# Patient Record
Sex: Female | Born: 1955 | Race: White | Hispanic: No | Marital: Married | Smoking: Former smoker
Health system: Southern US, Community
[De-identification: ages and names within clinical notes are randomized; demographics above are authoritative.]

## PROBLEM LIST (undated history)

## (undated) DIAGNOSIS — T7840XA Allergy, unspecified, initial encounter: Secondary | ICD-10-CM

## (undated) DIAGNOSIS — E0590 Thyrotoxicosis, unspecified without thyrotoxic crisis or storm: Secondary | ICD-10-CM

## (undated) DIAGNOSIS — Z720 Tobacco use: Secondary | ICD-10-CM

## (undated) DIAGNOSIS — E785 Hyperlipidemia, unspecified: Secondary | ICD-10-CM

## (undated) DIAGNOSIS — R5600 Simple febrile convulsions: Secondary | ICD-10-CM

## (undated) DIAGNOSIS — Z972 Presence of dental prosthetic device (complete) (partial): Secondary | ICD-10-CM

## (undated) DIAGNOSIS — Z9889 Other specified postprocedural states: Secondary | ICD-10-CM

## (undated) DIAGNOSIS — R112 Nausea with vomiting, unspecified: Secondary | ICD-10-CM

## (undated) DIAGNOSIS — M1990 Unspecified osteoarthritis, unspecified site: Secondary | ICD-10-CM

## (undated) DIAGNOSIS — K219 Gastro-esophageal reflux disease without esophagitis: Secondary | ICD-10-CM

## (undated) DIAGNOSIS — Z8669 Personal history of other diseases of the nervous system and sense organs: Secondary | ICD-10-CM

## (undated) DIAGNOSIS — E0500 Thyrotoxicosis with diffuse goiter without thyrotoxic crisis or storm: Secondary | ICD-10-CM

## (undated) HISTORY — DX: Hyperlipidemia, unspecified: E78.5

## (undated) HISTORY — PX: TONSILECTOMY, ADENOIDECTOMY, BILATERAL MYRINGOTOMY AND TUBES: SHX2538

## (undated) HISTORY — DX: Personal history of other diseases of the nervous system and sense organs: Z86.69

## (undated) HISTORY — PX: KNEE SURGERY: SHX244

## (undated) HISTORY — PX: TUBAL LIGATION: SHX77

## (undated) HISTORY — DX: Thyrotoxicosis, unspecified without thyrotoxic crisis or storm: E05.90

## (undated) HISTORY — DX: Unspecified osteoarthritis, unspecified site: M19.90

## (undated) HISTORY — DX: Gastro-esophageal reflux disease without esophagitis: K21.9

## (undated) HISTORY — DX: Allergy, unspecified, initial encounter: T78.40XA

## (undated) HISTORY — DX: Tobacco use: Z72.0

---

## 2000-12-16 ENCOUNTER — Other Ambulatory Visit: Admission: RE | Admit: 2000-12-16 | Discharge: 2000-12-16 | Payer: Self-pay | Admitting: Obstetrics and Gynecology

## 2001-11-29 ENCOUNTER — Other Ambulatory Visit: Admission: RE | Admit: 2001-11-29 | Discharge: 2001-11-29 | Payer: Self-pay | Admitting: Obstetrics and Gynecology

## 2002-12-20 ENCOUNTER — Ambulatory Visit (HOSPITAL_COMMUNITY): Admission: RE | Admit: 2002-12-20 | Discharge: 2002-12-20 | Payer: Self-pay | Admitting: Obstetrics and Gynecology

## 2003-01-19 ENCOUNTER — Other Ambulatory Visit: Admission: RE | Admit: 2003-01-19 | Discharge: 2003-01-19 | Payer: Self-pay | Admitting: Obstetrics and Gynecology

## 2003-01-20 ENCOUNTER — Encounter: Admission: RE | Admit: 2003-01-20 | Discharge: 2003-01-20 | Payer: Self-pay | Admitting: Obstetrics and Gynecology

## 2003-05-18 ENCOUNTER — Encounter: Admission: RE | Admit: 2003-05-18 | Discharge: 2003-05-18 | Payer: Self-pay | Admitting: Obstetrics and Gynecology

## 2004-02-15 ENCOUNTER — Other Ambulatory Visit: Admission: RE | Admit: 2004-02-15 | Discharge: 2004-02-15 | Payer: Self-pay | Admitting: Obstetrics and Gynecology

## 2004-02-20 ENCOUNTER — Encounter: Admission: RE | Admit: 2004-02-20 | Discharge: 2004-02-20 | Payer: Self-pay | Admitting: Obstetrics and Gynecology

## 2005-03-13 ENCOUNTER — Other Ambulatory Visit: Admission: RE | Admit: 2005-03-13 | Discharge: 2005-03-13 | Payer: Self-pay | Admitting: Obstetrics & Gynecology

## 2006-04-30 ENCOUNTER — Other Ambulatory Visit: Admission: RE | Admit: 2006-04-30 | Discharge: 2006-04-30 | Payer: Self-pay | Admitting: Obstetrics & Gynecology

## 2007-05-04 ENCOUNTER — Other Ambulatory Visit: Admission: RE | Admit: 2007-05-04 | Discharge: 2007-05-04 | Payer: Self-pay | Admitting: Obstetrics and Gynecology

## 2007-05-05 ENCOUNTER — Encounter: Admission: RE | Admit: 2007-05-05 | Discharge: 2007-05-05 | Payer: Self-pay | Admitting: Obstetrics and Gynecology

## 2010-06-25 ENCOUNTER — Other Ambulatory Visit (HOSPITAL_COMMUNITY): Payer: Self-pay | Admitting: Internal Medicine

## 2010-06-25 DIAGNOSIS — E0590 Thyrotoxicosis, unspecified without thyrotoxic crisis or storm: Secondary | ICD-10-CM

## 2010-07-10 ENCOUNTER — Encounter (HOSPITAL_COMMUNITY)
Admission: RE | Admit: 2010-07-10 | Discharge: 2010-07-10 | Disposition: A | Discharge status: Home / Self Care | Payer: BC Managed Care – PPO | Source: Ambulatory Visit | Attending: Internal Medicine | Admitting: Internal Medicine

## 2010-07-10 DIAGNOSIS — E0590 Thyrotoxicosis, unspecified without thyrotoxic crisis or storm: Secondary | ICD-10-CM | POA: Insufficient documentation

## 2010-07-11 ENCOUNTER — Encounter (HOSPITAL_COMMUNITY)
Admission: RE | Admit: 2010-07-11 | Discharge: 2010-07-11 | Disposition: A | Discharge status: Home / Self Care | Payer: BC Managed Care – PPO | Source: Ambulatory Visit | Attending: Internal Medicine | Admitting: Internal Medicine

## 2010-07-11 MED ORDER — SODIUM PERTECHNETATE TC 99M INJECTION
9.6000 | Freq: Once | INTRAVENOUS | Status: AC | PRN
  Administered 2010-07-11: 9.6 via INTRAVENOUS

## 2010-07-11 MED ORDER — SODIUM IODIDE I 131 CAPSULE
10.1000 | Freq: Once | INTRAVENOUS | Status: AC | PRN
  Administered 2010-07-10: 10.1 via ORAL

## 2011-06-30 ENCOUNTER — Encounter: Payer: Self-pay | Admitting: *Deleted

## 2014-02-22 ENCOUNTER — Ambulatory Visit: Payer: Self-pay | Admitting: Podiatry

## 2014-07-25 ENCOUNTER — Ambulatory Visit (INDEPENDENT_AMBULATORY_CARE_PROVIDER_SITE_OTHER): Payer: BC Managed Care – PPO | Admitting: Family Medicine

## 2014-07-25 ENCOUNTER — Encounter: Payer: Self-pay | Admitting: Family Medicine

## 2014-07-25 ENCOUNTER — Encounter (INDEPENDENT_AMBULATORY_CARE_PROVIDER_SITE_OTHER): Payer: Self-pay

## 2014-07-25 VITALS — BP 110/82 | HR 85 | Temp 98.6°F | Ht 62.25 in | Wt 134.5 lb

## 2014-07-25 DIAGNOSIS — G43909 Migraine, unspecified, not intractable, without status migrainosus: Secondary | ICD-10-CM | POA: Insufficient documentation

## 2014-07-25 DIAGNOSIS — J309 Allergic rhinitis, unspecified: Secondary | ICD-10-CM

## 2014-07-25 DIAGNOSIS — M1990 Unspecified osteoarthritis, unspecified site: Secondary | ICD-10-CM | POA: Diagnosis not present

## 2014-07-25 DIAGNOSIS — G44229 Chronic tension-type headache, not intractable: Secondary | ICD-10-CM | POA: Diagnosis not present

## 2014-07-25 DIAGNOSIS — G43B0 Ophthalmoplegic migraine, not intractable: Secondary | ICD-10-CM

## 2014-07-25 DIAGNOSIS — Z13220 Encounter for screening for lipoid disorders: Secondary | ICD-10-CM | POA: Diagnosis not present

## 2014-07-25 LAB — LIPID PANEL
Cholesterol: 202 mg/dL — ABNORMAL HIGH (ref 0–200)
HDL: 40.2 mg/dL (ref >39.00)
LDL Cholesterol: 136 mg/dL — ABNORMAL HIGH (ref 0–99)
NonHDL: 161.8
Total CHOL/HDL Ratio: 5
Triglycerides: 131 mg/dL (ref 0.0–149.0)
VLDL: 26.2 mg/dL (ref 0.0–40.0)

## 2014-07-25 LAB — CBC WITH DIFFERENTIAL/PLATELET
Basophils Absolute: 0 10*3/uL (ref 0.0–0.1)
Basophils Relative: 0.4 % (ref 0.0–3.0)
Eosinophils Absolute: 0.2 10*3/uL (ref 0.0–0.7)
Eosinophils Relative: 2.3 % (ref 0.0–5.0)
HCT: 39.5 % (ref 36.0–46.0)
Hemoglobin: 13 g/dL (ref 12.0–15.0)
Lymphocytes Relative: 31.9 % (ref 12.0–46.0)
Lymphs Abs: 3.4 10*3/uL (ref 0.7–4.0)
MCHC: 33 g/dL (ref 30.0–36.0)
MCV: 87 fl (ref 78.0–100.0)
Monocytes Absolute: 1 10*3/uL (ref 0.1–1.0)
Monocytes Relative: 9.3 % (ref 3.0–12.0)
Neutro Abs: 5.9 10*3/uL (ref 1.4–7.7)
Neutrophils Relative %: 56.1 % (ref 43.0–77.0)
Platelets: 307 10*3/uL (ref 150.0–400.0)
RBC: 4.54 Mil/uL (ref 3.87–5.11)
RDW: 14.3 % (ref 11.5–15.5)
WBC: 10.5 10*3/uL (ref 4.0–10.5)

## 2014-07-25 LAB — COMPREHENSIVE METABOLIC PANEL
ALT: 11 U/L (ref 0–35)
AST: 17 U/L (ref 0–37)
Albumin: 3.9 g/dL (ref 3.5–5.2)
Alkaline Phosphatase: 102 U/L (ref 39–117)
BUN: 8 mg/dL (ref 6–23)
CO2: 27 mEq/L (ref 19–32)
Calcium: 9.5 mg/dL (ref 8.4–10.5)
Chloride: 106 mEq/L (ref 96–112)
Creatinine, Ser: 0.66 mg/dL (ref 0.40–1.20)
GFR: 97.7 mL/min (ref >60.00)
Glucose, Bld: 94 mg/dL (ref 70–99)
Potassium: 4 mEq/L (ref 3.5–5.1)
Sodium: 138 mEq/L (ref 135–145)
Total Bilirubin: 0.3 mg/dL (ref 0.2–1.2)
Total Protein: 7 g/dL (ref 6.0–8.3)

## 2014-07-25 LAB — SEDIMENTATION RATE: Sed Rate: 28 mm/hr — ABNORMAL HIGH (ref 0–22)

## 2014-07-25 LAB — TSH: TSH: 0.09 u[IU]/mL — ABNORMAL LOW (ref 0.35–4.50)

## 2014-07-25 MED ORDER — TOPIRAMATE 25 MG PO CPSP: 25.0000 mg | ORAL_CAPSULE | Freq: Every day | ORAL | Status: DC

## 2014-07-25 MED ORDER — SUMATRIPTAN SUCCINATE 25 MG PO TABS: 25.0000 mg | ORAL_TABLET | ORAL | Status: DC | PRN

## 2014-07-25 NOTE — Assessment & Plan Note (Signed)
Will request records from rheumatology. Per pt request, check basic rheum labs today. Advised Tylenol, assuming liver function ok. The patient indicates understanding of these issues and agrees with the plan.

## 2014-07-25 NOTE — Progress Notes (Signed)
Subjective:   Patient ID: Lydia Carter, female    DOB: 07-13-1955, 59 y.o.   MRN: [id number]  [name] is a pleasant 59 y.o. year old female who presents to clinic today with [place] and Migraine  on 07/25/2014  HPI:  Allergic rhinitis- followed by allergist.  Uses nasocort and antihistamines as needed.  Has been pretty stable.  Headaches- was told years ago that she had an opthalmic migraine- "cut glass" appearance in her eye along with a headache and nausea.  Did not have one of those for years but past two months has had 7 or 8.  "taking BC powders like candy."  Now has a daily headache but not all of them are associated with visual changes and nausea.  She is not sure of her triggers but has been under significant stress at work.  Her boss of almost 20 years abruptly left. Sleeping ok.  Denies feeling depressed but does get anxious at work.  Was told years ago - ? RA.  She is adopted, unsure of family history. Did see rheum at one point- Dr. Estanislado Pandy but has not seen her in years.  She does have chronic joint pain.  Remains very active.  No current outpatient prescriptions on file prior to visit.   No current facility-administered medications on file prior to visit.    Allergies  Allergen Reactions  . Atenolol     Made her "emotional and cry"  . Codeine     Past Medical History  Diagnosis Date  . Hyperlipidemia   . Tobacco abuse   . History of migraine headaches   . Arthritis   . GERD (gastroesophageal reflux disease)   . Allergy     Past Surgical History  Procedure Laterality Date  . Tonsilectomy, adenoidectomy, bilateral myringotomy and tubes    . Tubal ligation    . Knee surgery      Family History  Problem Relation Age of Onset  . Adopted: Yes    History   Social History  . Marital Status: Married    Spouse Name: N/A  . Number of Children: N/A  . Years of Education: N/A   Occupational History  . Not on file.   Social History Main  Topics  . Smoking status: Former Smoker    Quit date: 08/18/2011  . Smokeless tobacco: Never Used  . Alcohol Use: Yes  . Drug Use: No  . Sexual Activity: Yes   Other Topics Concern  . Not on file   Social History Narrative   The PMH, PSH, Social History, Family History, Medications, and allergies have been reviewed in [facility], and have been updated if relevant.   Review of Systems  Eyes: Positive for photophobia and visual disturbance. Negative for pain and redness.  Respiratory: Negative.   Cardiovascular: Negative.   Gastrointestinal: Negative.   Endocrine: Negative.   Genitourinary: Negative.   Musculoskeletal: Positive for joint swelling and arthralgias.  Skin: Negative.   Neurological: Positive for headaches. Negative for dizziness, tremors, seizures, syncope, facial asymmetry, speech difficulty, weakness, light-headedness and numbness.  Hematological: Negative.   Psychiatric/Behavioral: Negative for suicidal ideas, hallucinations, confusion, sleep disturbance, self-injury and decreased concentration. The patient is nervous/anxious.   All other systems reviewed and are negative.      Objective:    BP 110/82 mmHg  Pulse 85  Temp(Src) 98.6 F (37 C) (Oral)  Ht 5' 2.25" (1.581 m)  Wt 134 lb 8 oz (61.009 kg)  BMI 24.41 kg/m2  SpO2 97%   Physical Exam  Constitutional: She is oriented to person, place, and time. She appears well-developed and well-nourished. No distress.  HENT:  Head: Normocephalic and atraumatic.  Eyes: Conjunctivae are normal.  Neck: Normal range of motion. Neck supple.  Cardiovascular: Normal rate and regular rhythm.   Pulmonary/Chest: Effort normal and breath sounds normal. No respiratory distress. She has no wheezes. She has no rales. She exhibits no tenderness.  Abdominal: Soft.  Musculoskeletal: Normal range of motion. She exhibits no edema.  Neurological: She is alert and oriented to person, place, and time. No cranial nerve deficit.  Skin:  Skin is warm and dry. No rash noted. No erythema. No pallor.  Psychiatric: She has a normal mood and affect. Her behavior is normal. Judgment and thought content normal.  Nursing note and vitals reviewed.         Assessment & Plan:   Ophthalmoplegic migraine, not intractable - Plan: Comprehensive metabolic panel, CBC with Differential/Platelet, TSH  Chronic tension-type headache, not intractable  Screening, lipid - Plan: Lipid panel  Allergic rhinitis, unspecified allergic rhinitis type No Follow-up on file.

## 2014-07-25 NOTE — Assessment & Plan Note (Signed)
>  30 minutes spent in face to face time with patient, >50% spent in counselling or coordination of care discussing headaches and arthritis. Likely has more than one type at this point- medication overuse/rebound tension headaches and occular migraines.  Start topamax 25 mg daily for prophylaxis, imitrex 25 mg prn migraine.  Keep headache journal. STOP taking BC powders.  Check labs today to assess liver and kidney function.

## 2014-07-25 NOTE — Progress Notes (Signed)
Pre visit review using our clinic review tool, if applicable. No additional management support is needed unless otherwise documented below in the visit note. 

## 2014-07-25 NOTE — Patient Instructions (Signed)
Nice to meet you. We are starting Topamax 25 mg nightly, as needed imitrex for migraines.  Please follow up with me in 1 month for complete physical.

## 2014-07-26 ENCOUNTER — Other Ambulatory Visit: Payer: Self-pay | Admitting: Family Medicine

## 2014-07-26 DIAGNOSIS — R7989 Other specified abnormal findings of blood chemistry: Secondary | ICD-10-CM

## 2014-07-26 LAB — RHEUMATOID FACTOR: Rheumatoid fact SerPl-aCnc: <10 IU/mL (ref <=14)

## 2014-07-26 LAB — ANA: Anti Nuclear Antibody(ANA): NEGATIVE

## 2014-08-04 ENCOUNTER — Encounter: Payer: Self-pay | Admitting: *Deleted

## 2014-08-14 ENCOUNTER — Encounter: Payer: Self-pay | Admitting: Family Medicine

## 2014-09-04 ENCOUNTER — Encounter: Payer: BC Managed Care – PPO | Admitting: Family Medicine

## 2014-09-11 ENCOUNTER — Telehealth: Payer: Self-pay

## 2014-09-11 NOTE — Telephone Encounter (Signed)
Left a voicemail for patient to return my call, in regards to scheduling a Mammogram.  

## 2014-09-13 ENCOUNTER — Encounter: Payer: Self-pay | Admitting: Family Medicine

## 2014-09-13 ENCOUNTER — Other Ambulatory Visit (HOSPITAL_COMMUNITY)
Admission: RE | Admit: 2014-09-13 | Discharge: 2014-09-13 | Disposition: A | Discharge status: Home / Self Care | Payer: BC Managed Care – PPO | Source: Ambulatory Visit | Attending: Family Medicine | Admitting: Family Medicine

## 2014-09-13 ENCOUNTER — Telehealth: Payer: Self-pay | Admitting: Family Medicine

## 2014-09-13 ENCOUNTER — Ambulatory Visit (INDEPENDENT_AMBULATORY_CARE_PROVIDER_SITE_OTHER): Payer: BC Managed Care – PPO | Admitting: Family Medicine

## 2014-09-13 ENCOUNTER — Other Ambulatory Visit: Payer: Self-pay | Admitting: Family Medicine

## 2014-09-13 VITALS — BP 128/70 | HR 91 | Temp 97.6°F | Ht 62.0 in | Wt 131.0 lb

## 2014-09-13 DIAGNOSIS — J309 Allergic rhinitis, unspecified: Secondary | ICD-10-CM | POA: Diagnosis not present

## 2014-09-13 DIAGNOSIS — T7840XS Allergy, unspecified, sequela: Secondary | ICD-10-CM

## 2014-09-13 DIAGNOSIS — Z01419 Encounter for gynecological examination (general) (routine) without abnormal findings: Secondary | ICD-10-CM | POA: Insufficient documentation

## 2014-09-13 DIAGNOSIS — Z0000 Encounter for general adult medical examination without abnormal findings: Secondary | ICD-10-CM | POA: Diagnosis not present

## 2014-09-13 DIAGNOSIS — M1990 Unspecified osteoarthritis, unspecified site: Secondary | ICD-10-CM

## 2014-09-13 DIAGNOSIS — Z8639 Personal history of other endocrine, nutritional and metabolic disease: Secondary | ICD-10-CM

## 2014-09-13 DIAGNOSIS — R946 Abnormal results of thyroid function studies: Secondary | ICD-10-CM

## 2014-09-13 DIAGNOSIS — R7989 Other specified abnormal findings of blood chemistry: Secondary | ICD-10-CM

## 2014-09-13 DIAGNOSIS — G44229 Chronic tension-type headache, not intractable: Secondary | ICD-10-CM

## 2014-09-13 DIAGNOSIS — Z1211 Encounter for screening for malignant neoplasm of colon: Secondary | ICD-10-CM

## 2014-09-13 DIAGNOSIS — Z1239 Encounter for other screening for malignant neoplasm of breast: Secondary | ICD-10-CM

## 2014-09-13 DIAGNOSIS — Z1151 Encounter for screening for human papillomavirus (HPV): Secondary | ICD-10-CM | POA: Diagnosis present

## 2014-09-13 DIAGNOSIS — T7840XA Allergy, unspecified, initial encounter: Secondary | ICD-10-CM | POA: Insufficient documentation

## 2014-09-13 LAB — T3, FREE: T3, Free: 5.7 pg/mL — ABNORMAL HIGH (ref 2.3–4.2)

## 2014-09-13 LAB — TSH: TSH: 0.08 u[IU]/mL — ABNORMAL LOW (ref 0.35–4.50)

## 2014-09-13 LAB — T4, FREE: Free T4: 2.07 ng/dL — ABNORMAL HIGH (ref 0.60–1.60)

## 2014-09-13 MED ORDER — METHIMAZOLE 5 MG PO TABS: ORAL_TABLET | ORAL | Status: DC

## 2014-09-13 NOTE — Progress Notes (Signed)
Pre visit review using our clinic review tool, if applicable. No additional management support is needed unless otherwise documented below in the visit note. 

## 2014-09-13 NOTE — Telephone Encounter (Signed)
Pt saw Dr. Deborra Medina today. She was asked to call when she got home to give medication information of what she was taking. This is the information she provided [place] with: Methimazole 5mg  1/2 pill every other day.  Best Number to contact pt back at is [phone].

## 2014-09-13 NOTE — Progress Notes (Signed)
Subjective:   Patient ID: Lydia Carter, female    DOB: Sep 20, 1955, 59 y.o.   MRN: [id number]  PENNE ROSENSTOCK is a pleasant 59 y.o. year old female who presents to clinic today with Annual Exam  and follow up of chronic medical conditions on 09/13/2014  HPI: Established care with me last month.  LMP over 10 years ago.  No h/o post menopausal bleeding. She is adopted and unsure of family history of cancers. Due for mammogram.  Not willing to have a colonoscopy although daughter currently being treated for colon CA.  She thinks her insurance will not cover it.  She is willing to do stool cards.  Denies changes in bowel habits or blood in her stool.  H/o hyperthyroidism- was followed by endocrinology [facility].  Was taking methimazole which she has not taken in years and she is not sure why he told her stop taking it.  At that time, TSH was low but she did not return our call to schedule follow up/additional labs- TSH, FT, T3 She reports not having any palpitations or "shakiness" she had when she was first diagnosed.   Lab Results  Component Value Date   TSH 0.09* 07/25/2014   Lab Results  Component Value Date   CHOL 202* 07/25/2014   HDL 40.20 07/25/2014   LDLCALC 136* 07/25/2014   TRIG 131.0 07/25/2014   CHOLHDL 5 07/25/2014    Lab Results  Component Value Date   WBC 10.5 07/25/2014   HGB 13.0 07/25/2014   HCT 39.5 07/25/2014   MCV 87.0 07/25/2014   PLT 307.0 07/25/2014   Lab Results  Component Value Date   ALT 11 07/25/2014   AST 17 07/25/2014   ALKPHOS 102 07/25/2014   BILITOT 0.3 07/25/2014   Current Outpatient Prescriptions on File Prior to Visit  Medication Sig Dispense Refill  . SUMAtriptan (IMITREX) 25 MG tablet Take 1 tablet (25 mg total) by mouth every 2 (two) hours as needed for migraine. May repeat in 2 hours if headache persists or recurs. 10 tablet 0  . topiramate (TOPAMAX) 25 MG capsule Take 1 capsule (25 mg total) by mouth at bedtime. 30 capsule 3   No  current facility-administered medications on file prior to visit.    Allergies  Allergen Reactions  . Atenolol     Made her "emotional and cry"  . Codeine     Past Medical History  Diagnosis Date  . Hyperlipidemia   . Tobacco abuse   . History of migraine headaches   . Arthritis   . GERD (gastroesophageal reflux disease)   . Allergy   . Hyperthyroidism     Past Surgical History  Procedure Laterality Date  . Tonsilectomy, adenoidectomy, bilateral myringotomy and tubes    . Tubal ligation    . Knee surgery      Family History  Problem Relation Age of Onset  . Adopted: Yes    History   Social History  . Marital Status: Married    Spouse Name: N/A  . Number of Children: N/A  . Years of Education: N/A   Occupational History  . Not on file.   Social History Main Topics  . Smoking status: Former Smoker    Quit date: 08/18/2011  . Smokeless tobacco: Never Used  . Alcohol Use: Yes  . Drug Use: No  . Sexual Activity: Yes   Other Topics Concern  . Not on file   Social History Narrative   The PMH, [place], Social  History, Family History, Medications, and allergies have been reviewed in [facility], and have been updated if relevant.  Review of Systems  Constitutional: Negative.   HENT: Negative.   Eyes: Negative.   Respiratory: Negative.   Cardiovascular: Negative.   Gastrointestinal: Negative.   Endocrine: Negative.   Genitourinary: Negative.   Musculoskeletal: Negative.   Skin: Negative.   Allergic/Immunologic: Negative.   Neurological: Negative.   Hematological: Negative.   Psychiatric/Behavioral: Negative.   All other systems reviewed and are negative.      Objective:    BP 128/70 mmHg  Pulse 91  Temp(Src) 97.6 F (36.4 C) (Tympanic)  Ht 5\' 2"  (1.575 m)  Wt 131 lb (59.421 kg)  BMI 23.95 kg/m2  SpO2 96%   Physical Exam    General:  Well-developed,well-nourished,in no acute distress; alert,appropriate and cooperative throughout  examination Head:  normocephalic and atraumatic.   Eyes:  vision grossly intact, pupils equal, pupils round, and pupils reactive to light.   Ears:  R ear normal and L ear normal.   Nose:  no external deformity.   Mouth:  good dentition.   Neck:  No deformities, masses, or tenderness noted. Breasts:  No mass, nodules, thickening, tenderness, bulging, retraction, inflamation, nipple discharge or skin changes noted.   Lungs:  Normal respiratory effort, chest expands symmetrically. Lungs are clear to auscultation, no crackles or wheezes. Heart:  Normal rate and regular rhythm. S1 and S2 normal without gallop, murmur, click, rub or other extra sounds. Abdomen:  Bowel sounds positive,abdomen soft and non-tender without masses, organomegaly or hernias noted. Rectal:  no external abnormalities.   Genitalia:  Pelvic Exam:        External: normal female genitalia without lesions or masses        Vagina: normal without lesions or masses        Cervix: normal without lesions or masses        Adnexa: normal bimanual exam without masses or fullness        Uterus: normal by palpation        Pap smear: performed Msk:  No deformity or scoliosis noted of thoracic or lumbar spine.   Extremities:  No clubbing, cyanosis, edema, or deformity noted with normal full range of motion of all joints.   Neurologic:  alert & oriented X3 and gait normal.   Skin:  Intact without suspicious lesions or rashes Cervical Nodes:  No lymphadenopathy noted Axillary Nodes:  No palpable lymphadenopathy Psych:  Cognition and judgment appear intact. Alert and cooperative with normal attention span and concentration. No apparent delusions, illusions, hallucinations      Assessment & Plan:   Well woman exam  Low TSH level - Plan: TSH, T4, Free, T3, Free  Allergic rhinitis, unspecified allergic rhinitis type  Arthritis  Chronic tension-type headache, not intractable  Allergy, sequela No Follow-up on file.

## 2014-09-13 NOTE — Patient Instructions (Signed)
Great to see you. Please call your insurance company to see if they cover colonoscopy.  Please schedule your mammogram.

## 2014-09-13 NOTE — Addendum Note (Signed)
Addended by: Despina Hidden on: 09/13/2014 08:00 AM   Modules accepted: Orders

## 2014-09-13 NOTE — Assessment & Plan Note (Signed)
With history of hyperthyroidism. Check complete thyroid panel.  Refer to endo.  Needs to restart rx- ?if she needs another thyroid RAI scan.

## 2014-09-13 NOTE — Assessment & Plan Note (Signed)
Reviewed preventive care protocols, scheduled due services, and updated immunizations Discussed nutrition, exercise, diet, and healthy lifestyle.  Pap smear today.  Due for mammogram- order entered and number given to pt to schedule. Stool cards.

## 2014-09-13 NOTE — Addendum Note (Signed)
Addended by: Ellamae Sia on: 09/13/2014 11:47 AM   Modules accepted: Orders

## 2014-09-15 LAB — CYTOLOGY - PAP

## 2014-09-18 ENCOUNTER — Encounter: Payer: Self-pay | Admitting: *Deleted

## 2015-02-15 ENCOUNTER — Telehealth: Payer: Self-pay

## 2015-02-15 NOTE — Telephone Encounter (Signed)
Pt request Lydia Carter mouthwash called in for thrush; pt last seen 09/13/14 for annual exam; advised needed to make appt to verify thrush and to see what best med to be prescribed. Dr Deborra Medina never treated before. Pt said that was OK Dr Donneta Romberg would prescribe for pt and pt did not want to schedule appt. Pt will cb if needed.

## 2015-03-05 ENCOUNTER — Telehealth: Payer: Self-pay

## 2015-03-05 ENCOUNTER — Ambulatory Visit (INDEPENDENT_AMBULATORY_CARE_PROVIDER_SITE_OTHER): Payer: BC Managed Care – PPO | Admitting: Family Medicine

## 2015-03-05 ENCOUNTER — Encounter: Payer: Self-pay | Admitting: Family Medicine

## 2015-03-05 VITALS — BP 110/54 | HR 96 | Temp 98.3°F | Wt 135.8 lb

## 2015-03-05 DIAGNOSIS — R6889 Other general symptoms and signs: Secondary | ICD-10-CM | POA: Diagnosis not present

## 2015-03-05 DIAGNOSIS — N649 Disorder of breast, unspecified: Secondary | ICD-10-CM

## 2015-03-05 DIAGNOSIS — N6459 Other signs and symptoms in breast: Secondary | ICD-10-CM

## 2015-03-05 MED ORDER — CEPHALEXIN 500 MG PO CAPS: 500.0000 mg | ORAL_CAPSULE | Freq: Four times a day (QID) | ORAL | Status: DC

## 2015-03-05 NOTE — Progress Notes (Signed)
Pre visit review using our clinic review tool, if applicable. No additional management support is needed unless otherwise documented below in the visit note.  Lesion on R breast present for about 4 days.  Noted this past Friday night.  Couldn't find it initially Saturday AM, but then it started draining later on Saturday.  No L breast lesion.  No fevers, none >100.   Drainage from the breast, at the edge of the nipple is yellow.  Sore to the touch now, much smaller now than prev.    She had an incidental R arm strain recently, with some residual aching in the arm that she thought was incidental.    Meds, vitals, and allergies reviewed.   ROS: See HPI.  Otherwise, noncontributory.  Chaperoned exam.   nad B breast with normal exam except for slight irritation w/o drainage or spreading erythema medial to the central portion of the R nipple w/o fluctuance, no axillary LA.

## 2015-03-05 NOTE — Telephone Encounter (Signed)
PLEASE NOTE: All timestamps contained within this report are represented as [place] Standard Time. CONFIDENTIALTY NOTICE: This fax transmission is intended only for the addressee. It contains information that is legally privileged, confidential or otherwise protected from use or disclosure. If you are not the intended recipient, you are strictly prohibited from reviewing, disclosing, copying using or disseminating any of this information or taking any action in reliance on or regarding this information. If you have received this fax in error, please notify us immediately by telephone so that we can arrange for its return to [place]. Phone: [phone], Toll-Free: [phone], Fax: [phone] Page: 1 of 2 Call Id: [id number] [place] Patient Name: Lydia Carter Gender: Female DOB: [birth year] Age: 60 Y 9 M 15 D Return Phone Number: [phone] (Primary) Address: City/State/Zip: [place] Client [place] Night - Client Client Site [place] Physician Arnette Norris Contact Type Call Call Type Triage / Clinical Caller Name Calliegh Relationship To Patient Self Return Phone Number [phone] (Primary) Chief Complaint Breast Symptoms Initial Comment Caller States Have a spot on my breast I need to speak with MD about PreDisposition Call Doctor Nurse Assessment Nurse: Andria Frames, RN, Aeriel Date/Time (Eastern Time): 03/03/2015 6:04:46 PM Confirm and document reason for call. If symptomatic, describe symptoms. You must click the next button to save text entered. ---Caller states, she thinks she has an abscess on the side of her nipple and it has pus on it. Last night it felt tender. Caller states, it is on the side of the nipple. Caller states, it is opened up and a lot of pus. Caller states, unsure of fever. Caller states, she has a sinus thing going  on. Has the patient traveled out of the country within the last 30 days? ---No Does the patient have any new or worsening symptoms? ---Yes Will a triage be completed? ---Yes Related visit to physician within the last 2 weeks? ---No Does the PT have any chronic conditions? (i.e. diabetes, asthma, etc.) ---Yes List chronic conditions. ---seasonal asthma Is this a behavioral health or substance abuse call? ---No Guidelines Guideline Title Affirmed Question Affirmed Notes Nurse Date/Time (Eastern Time) Breast Symptoms [1] Red area AND [2] fever Hensel, RN, Aeriel 03/03/2015 6:07:37 PM Disp. Time Eilene Ghazi Time) Disposition Final User 03/03/2015 6:14:06 PM See Physician within 4 Hours (or PCP triage) Yes Hensel, RN, Aeriel PLEASE NOTE: All timestamps contained within this report are represented as [place] Standard Time. CONFIDENTIALTY NOTICE: This fax transmission is intended only for the addressee. It contains information that is legally privileged, confidential or otherwise protected from use or disclosure. If you are not the intended recipient, you are strictly prohibited from reviewing, disclosing, copying using or disseminating any of this information or taking any action in reliance on or regarding this information. If you have received this fax in error, please notify us immediately by telephone so that we can arrange for its return to [place]. Phone: [phone], Toll-Free: [phone], Fax: [phone] Page: 2 of 2 Call Id: [id number] [place] Understands: Yes Disagree/Comply: Disagree Disagree/Comply Reason: Wait and see Care Advice Given Per Guideline SEE PHYSICIAN WITHIN 4 HOURS (or PCP triage): * IF OFFICE WILL BE CLOSED AND NO PCP TRIAGE: You need to be seen within the next 3 or 4 hours. A nearby Urgent Care Center is often a good source of care. Another choice is to go to the ER. Go sooner if you  become worse. FEVER MEDICINES: * For fever relief, take acetaminophen or ibuprofen. *  Treat fevers above 101 F (38.3 C). * The goal of fever therapy is to bring the fever down to a comfortable level. Remember that fever medicine usually lowers fever 2-3 F (1-1.5 C). CAUTION - NSAIDS (E.G., IBUPROFEN, NAPROXEN): * Do not take nonsteroidal antiinflammatory drugs (NSAIDs) if you have stomach problems, kidney disease, heart failure, or other contraindications to using this type of medication. * Do not take NSAID medications for over 7 days without consulting your PCP. * GASTROINTESTINAL RISK: There is an increased risk of stomach ulcers, GI bleeding, perforation. * CARDIOVASCULAR RISK: There may be an increased risk of heart attack and stroke. CALL BACK IF: * You become worse. CARE ADVICE given per Breast Symptoms (Adult) guideline. After Care Instructions Given Call Event Type User Date / Time Description Comments User: Su Ley, RN Date/Time Eilene Ghazi Time): 03/03/2015 6:07:15 PM Pt also has RA, and Graves Disease User: Su Ley, RN Date/Time (Eastern Time): 03/03/2015 6:09:33 PM Pt states, the pus on the breast is white, and yellow green. User: Su Ley, RN Date/Time Eilene Ghazi Time): 03/03/2015 6:14:55 PM Pt does not want to go to the ER, nurse did advise an UCC, pt continues to decline triage outcome. Referrals GO TO FACILITY REFUSED

## 2015-03-05 NOTE — Telephone Encounter (Signed)
Spoke with pt and still has some drainage if she mashes on area; pt has appt 03/05/15 at 3:45 with Dr Damita Dunnings. Pt voiced understanding.

## 2015-03-05 NOTE — Telephone Encounter (Signed)
Noted. Thanks. Will see at OV.  

## 2015-03-05 NOTE — Patient Instructions (Signed)
Start the antibiotics.  Warm compresses may help.  Lydia Carter will call about your referral. Take care.  Glad to see you.

## 2015-03-06 DIAGNOSIS — N6459 Other signs and symptoms in breast: Secondary | ICD-10-CM | POA: Insufficient documentation

## 2015-03-06 NOTE — Assessment & Plan Note (Signed)
No fluctuance for I&D.  Start keflex.  Will check mammogram.  Okay for outpatient fu.  D/w pt.

## 2015-03-14 ENCOUNTER — Ambulatory Visit
Admission: RE | Admit: 2015-03-14 | Discharge: 2015-03-14 | Disposition: A | Discharge status: Home / Self Care | Payer: BC Managed Care – PPO | Source: Ambulatory Visit | Attending: Family Medicine | Admitting: Family Medicine

## 2015-03-14 ENCOUNTER — Encounter: Payer: Self-pay | Admitting: Family Medicine

## 2015-03-14 DIAGNOSIS — N649 Disorder of breast, unspecified: Secondary | ICD-10-CM

## 2015-03-14 LAB — HM MAMMOGRAPHY: HM Mammogram: NORMAL

## 2015-06-13 ENCOUNTER — Ambulatory Visit (INDEPENDENT_AMBULATORY_CARE_PROVIDER_SITE_OTHER): Payer: BC Managed Care – PPO

## 2015-06-13 ENCOUNTER — Ambulatory Visit (INDEPENDENT_AMBULATORY_CARE_PROVIDER_SITE_OTHER): Payer: BC Managed Care – PPO | Admitting: Podiatry

## 2015-06-13 ENCOUNTER — Encounter: Payer: Self-pay | Admitting: Podiatry

## 2015-06-13 VITALS — BP 135/79 | HR 99 | Resp 18

## 2015-06-13 DIAGNOSIS — M7661 Achilles tendinitis, right leg: Secondary | ICD-10-CM | POA: Diagnosis not present

## 2015-06-13 DIAGNOSIS — M722 Plantar fascial fibromatosis: Secondary | ICD-10-CM | POA: Diagnosis not present

## 2015-06-13 MED ORDER — CYCLOBENZAPRINE HCL 10 MG PO TABS: 10.0000 mg | ORAL_TABLET | Freq: Every day | ORAL | Status: DC

## 2015-06-13 MED ORDER — MELOXICAM 15 MG PO TABS: 15.0000 mg | ORAL_TABLET | Freq: Every day | ORAL | Status: DC

## 2015-06-13 MED ORDER — METHYLPREDNISOLONE 4 MG PO TBPK: ORAL_TABLET | ORAL | Status: DC

## 2015-06-13 NOTE — Progress Notes (Signed)
She presents today with a chief complaint of a painful right posterior heel and cramping at bedtime of her Achilles right greater than left. She is also relating some pain to the right heel which is been present since December. She's really tried nothing other than her shoe gear with her orthotics which is the only thing that she can do to calm the pain down. She states that she wears her shoe to bed with her orthotic in it. No changes in her past medical history medications or allergies.  Objective: Vital signs are stable alert and oriented 3. Pulses are strongly palpable bilateral. Neurologic sensorium is intact. She has no pain on palpation of the Achilles tendon however she does have tenderness on palpation of the medial continue tubercle of the right heel at the plantar fascia calcaneal insertion site. Radiographs taken today do demonstrate a soft tissue increase in density of the plantar fascia calcaneal insertion site but no signs of Achilles tendinitis.  Assessment: Plantar fasciitis right foot. Story Achilles tendinitis with spasms.  Plan: Started her on Flexeril, Medrol Dosepak to be followed by [employer]. Injected her right heel today and placed her in plantar fascia brace. She has a plantar fascia night splint at home and will start wearing that. I will follow-up with her in 1 month.

## 2015-07-11 ENCOUNTER — Ambulatory Visit: Payer: BC Managed Care – PPO | Admitting: Podiatry

## 2015-08-08 ENCOUNTER — Ambulatory Visit: Payer: BC Managed Care – PPO | Admitting: Podiatry

## 2015-09-19 ENCOUNTER — Ambulatory Visit (INDEPENDENT_AMBULATORY_CARE_PROVIDER_SITE_OTHER): Payer: BC Managed Care – PPO | Admitting: Podiatry

## 2015-09-19 DIAGNOSIS — M722 Plantar fascial fibromatosis: Secondary | ICD-10-CM | POA: Diagnosis not present

## 2015-09-20 NOTE — Progress Notes (Signed)
She presents then to complaint of pain to the right foot. She states that the right foot has been exquisitely painful less in the Achilles area however it is more painful now in the plantar fascia area.  Objective: Vital signs are stable she is alert and oriented 3 she has strong palpable pulses with no calf pain. She has pain on palpation medial tubercle of the right heel plantarly she also has some tenderness on palpation of the Achilles tendon in its insertion site.  Assessment: Plantar fasciitis with Achilles tendinitis.  Plan: Injected the plantar fasciitis today with Kenalog and local anesthetic she will continue conservative therapies utilize a night splint.

## 2015-09-25 ENCOUNTER — Telehealth: Payer: Self-pay | Admitting: Family Medicine

## 2015-09-25 ENCOUNTER — Other Ambulatory Visit: Payer: Self-pay

## 2015-09-25 DIAGNOSIS — R7989 Other specified abnormal findings of blood chemistry: Secondary | ICD-10-CM

## 2015-09-25 MED ORDER — METHIMAZOLE 5 MG PO TABS: ORAL_TABLET | ORAL | 0 refills | Status: DC

## 2015-09-25 NOTE — Telephone Encounter (Signed)
Pt needs referral to see specialist for thyroid  Dr Mee Hives fax [phone] Pt cb number is [phone]

## 2015-09-25 NOTE — Telephone Encounter (Signed)
Pt left v/m;while pt is waiting for referral for thyroid specialist pt [facility]. Last refilled # 30 on 09/13/14.Please advise. Last annual 08/2014. No future appt.

## 2015-09-25 NOTE — Telephone Encounter (Signed)
Referral placed.

## 2015-09-28 ENCOUNTER — Ambulatory Visit (INDEPENDENT_AMBULATORY_CARE_PROVIDER_SITE_OTHER): Payer: BC Managed Care – PPO | Admitting: Internal Medicine

## 2015-09-28 ENCOUNTER — Encounter: Payer: Self-pay | Admitting: Internal Medicine

## 2015-09-28 VITALS — BP 142/74 | HR 98 | Temp 98.0°F | Wt 125.8 lb

## 2015-09-28 DIAGNOSIS — F418 Other specified anxiety disorders: Secondary | ICD-10-CM | POA: Diagnosis not present

## 2015-09-28 DIAGNOSIS — B370 Candidal stomatitis: Secondary | ICD-10-CM | POA: Diagnosis not present

## 2015-09-28 MED ORDER — BUSPIRONE HCL 10 MG PO TABS: 10.0000 mg | ORAL_TABLET | Freq: Two times a day (BID) | ORAL | 2 refills | Status: DC

## 2015-09-28 NOTE — Progress Notes (Signed)
Subjective:    Patient ID: Lydia Carter, female    DOB: [birth year], 60 y.o.   MRN: [id number]  HPI  Pt presents to the clinic today to discuss anxiety. She reports this started 2 days ago after she received confirmation that her daughter is addicted to gambling and drugs. She is not sure what kind of drugs exactly. She has suspected this for months. Her daughter lives with her and she is very concerned about her wellbeing, and is worried that something bad may happen to her. She is fidgety, has racing thoughts, has no appetite and can not sleep. She denies depression, SI/HI. She has never been treated for anxiety in the past, but feels like she needs help now. She has an appt with a therapist to discuss these issues but she is interested in starting medication to help her as well.  She also reports thrush on her tongue. She reports her dentist gave her Nystatin rinse but she thought she should be taking Magic Mouthwash. She does not smoke or use inhalers.  Review of Systems      Past Medical History:  Diagnosis Date  . Allergy   . Arthritis   . GERD (gastroesophageal reflux disease)   . History of migraine headaches   . Hyperlipidemia   . Hyperthyroidism   . Tobacco abuse     Current Outpatient Prescriptions  Medication Sig Dispense Refill  . meloxicam (MOBIC) 15 MG tablet Take 1 tablet (15 mg total) by mouth daily. 30 tablet 3  . methimazole (TAPAZOLE) 5 MG tablet 1/2 tab by mouth every other day 30 tablet 0  . mometasone (NASONEX) 50 MCG/ACT nasal spray Place 2 sprays into the nose daily as needed.    . SUMAtriptan (IMITREX) 25 MG tablet Take 1 tablet (25 mg total) by mouth every 2 (two) hours as needed for migraine. May repeat in 2 hours if headache persists or recurs. 10 tablet 0  . busPIRone (BUSPAR) 10 MG tablet Take 1 tablet (10 mg total) by mouth 2 (two) times daily. 60 tablet 2  . cyclobenzaprine (FLEXERIL) 10 MG tablet Take 1 tablet (10 mg total) by mouth at bedtime.  (Patient not taking: Reported on 09/28/2015) 30 tablet 0  . methylPREDNISolone (MEDROL) 4 MG TBPK tablet Tapering 6 day dose pack (Patient not taking: Reported on 09/28/2015) 21 tablet 0  . topiramate (TOPAMAX) 25 MG capsule Take 1 capsule (25 mg total) by mouth at bedtime. (Patient not taking: Reported on 09/28/2015) 30 capsule 3   No current facility-administered medications for this visit.     Allergies  Allergen Reactions  . Atenolol     Made her "emotional and cry"  . Codeine     Family History  Problem Relation Age of Onset  . Adopted: Yes    Social History   Social History  . Marital status: Married    Spouse name: N/A  . Number of children: N/A  . Years of education: N/A   Occupational History  . Not on file.   Social History Main Topics  . Smoking status: Former Smoker    Quit date: 08/18/2011  . Smokeless tobacco: Never Used  . Alcohol use Yes  . Drug use: No  . Sexual activity: Yes   Other Topics Concern  . Not on file   Social History Narrative  . No narrative on file     Constitutional: Denies fever, malaise, fatigue, headache or abrupt weight changes.  HEENT: Pt reports coating on tongue.  Denies eye pain, eye redness, ear pain, ringing in the ears, wax buildup, runny nose, nasal congestion, bloody nose, or sore throat. Respiratory: Denies difficulty breathing, shortness of breath, cough or sputum production.   Cardiovascular: Denies chest pain, chest tightness, palpitations or swelling in the hands or feet.  Psych: Pt reports anxiety. Denies depression, SI/HI.  No other specific complaints in a complete review of systems (except as listed in HPI above).  Objective:   Physical Exam  BP (!) 142/74   Pulse 98   Temp 98 F (36.7 C) (Oral)   Wt 125 lb 12 oz (57 kg)   SpO2 98%   BMI 23.00 kg/m  Wt Readings from Last 3 Encounters:  09/28/15 125 lb 12 oz (57 kg)  03/05/15 135 lb 12 oz (61.6 kg)  09/13/14 131 lb (59.4 kg)    General: Appears  their stated age, well developed, well nourished in NAD. HEENT: Throat/Mouth: White coating noted on tongue.  Neurological: Alert and oriented.  Psychiatric: She is nervous and fidgeting. She is tearful at times.  BMET    Component Value Date/Time   NA 138 07/25/2014 1145   K 4.0 07/25/2014 1145   CL 106 07/25/2014 1145   CO2 27 07/25/2014 1145   GLUCOSE 94 07/25/2014 1145   BUN 8 07/25/2014 1145   CREATININE 0.66 07/25/2014 1145   CALCIUM 9.5 07/25/2014 1145    Lipid Panel     Component Value Date/Time   CHOL 202 (H) 07/25/2014 1145   TRIG 131.0 07/25/2014 1145   HDL 40.20 07/25/2014 1145   CHOLHDL 5 07/25/2014 1145   VLDL 26.2 07/25/2014 1145   LDLCALC 136 (H) 07/25/2014 1145    CBC    Component Value Date/Time   WBC 10.5 07/25/2014 1145   RBC 4.54 07/25/2014 1145   HGB 13.0 07/25/2014 1145   HCT 39.5 07/25/2014 1145   PLT 307.0 07/25/2014 1145   MCV 87.0 07/25/2014 1145   MCHC 33.0 07/25/2014 1145   RDW 14.3 07/25/2014 1145   LYMPHSABS 3.4 07/25/2014 1145   MONOABS 1.0 07/25/2014 1145   EOSABS 0.2 07/25/2014 1145   BASOSABS 0.0 07/25/2014 1145    Hgb A1C No results found for: HGBA1C          Assessment & Plan:   Situation anxiety:  PHQ2 and GAD questionnaire today Encouraged her to follow through with the appt with the therapist Support offered today Discussed treatment with non addictive medications, since her daughter does live with her eRx for Buspar 10 mg BID  Thrush:  Advised her to continue Nystatin Magic Mouthwash would not be effective  Follow up with PCP in 4 weeks  Webb Silversmith, NP

## 2015-09-28 NOTE — Patient Instructions (Signed)
Generalized Anxiety Disorder Generalized anxiety disorder (GAD) is a mental disorder. It interferes with life functions, including relationships, work, and school. GAD is different from normal anxiety, which everyone experiences at some point in their lives in response to specific life events and activities. Normal anxiety actually helps us prepare for and get through these life events and activities. Normal anxiety goes away after the event or activity is over.  GAD causes anxiety that is not necessarily related to specific events or activities. It also causes excess anxiety in proportion to specific events or activities. The anxiety associated with GAD is also difficult to control. GAD can vary from mild to severe. People with severe GAD can have intense waves of anxiety with physical symptoms (panic attacks).  SYMPTOMS The anxiety and worry associated with GAD are difficult to control. This anxiety and worry are related to many life events and activities and also occur more days than not for 6 months or longer. People with GAD also have three or more of the following symptoms (one or more in children):  Restlessness.   Fatigue.  Difficulty concentrating.   Irritability.  Muscle tension.  Difficulty sleeping or unsatisfying sleep. DIAGNOSIS GAD is diagnosed through an assessment by your health care provider. Your health care provider will ask you questions aboutyour mood,physical symptoms, and events in your life. Your health care provider may ask you about your medical history and use of alcohol or drugs, including prescription medicines. Your health care provider may also do a physical exam and blood tests. Certain medical conditions and the use of certain substances can cause symptoms similar to those associated with GAD. Your health care provider may refer you to a mental health specialist for further evaluation. TREATMENT The following therapies are usually used to treat GAD:    Medication. Antidepressant medication usually is prescribed for long-term daily control. Antianxiety medicines may be added in severe cases, especially when panic attacks occur.   Talk therapy (psychotherapy). Certain types of talk therapy can be helpful in treating GAD by providing support, education, and guidance. A form of talk therapy called cognitive behavioral therapy can teach you healthy ways to think about and react to daily life events and activities.  Stress managementtechniques. These include yoga, meditation, and exercise and can be very helpful when they are practiced regularly. A mental health specialist can help determine which treatment is best for you. Some people see improvement with one therapy. However, other people require a combination of therapies.   This information is not intended to replace advice given to you by your health care provider. Make sure you discuss any questions you have with your health care provider.   Document Released: 05/31/2012 Document Revised: 02/24/2014 Document Reviewed: 05/31/2012 Elsevier Interactive Patient Education 2016 [employer] Inc[employer]  

## 2016-02-15 ENCOUNTER — Telehealth: Payer: Self-pay

## 2016-02-15 NOTE — Telephone Encounter (Signed)
Pt left v/m; pt thought had allergies and now thinks has a sinus infection and request abx. I spoke with pt and advised she needed to be seen; no available appts today at [facility] locations but offered appt at [facility]; pt said no she would call Dr Donneta Romberg. FYI to Dr Deborra Medina.

## 2016-02-28 ENCOUNTER — Ambulatory Visit (INDEPENDENT_AMBULATORY_CARE_PROVIDER_SITE_OTHER): Payer: BC Managed Care – PPO | Admitting: Family Medicine

## 2016-02-28 ENCOUNTER — Encounter: Payer: Self-pay | Admitting: Family Medicine

## 2016-02-28 VITALS — BP 120/74 | HR 88 | Temp 98.2°F | Ht 62.5 in | Wt 133.2 lb

## 2016-02-28 DIAGNOSIS — E0590 Thyrotoxicosis, unspecified without thyrotoxic crisis or storm: Secondary | ICD-10-CM

## 2016-02-28 DIAGNOSIS — Z01419 Encounter for gynecological examination (general) (routine) without abnormal findings: Secondary | ICD-10-CM | POA: Diagnosis not present

## 2016-02-28 DIAGNOSIS — J019 Acute sinusitis, unspecified: Secondary | ICD-10-CM | POA: Diagnosis not present

## 2016-02-28 DIAGNOSIS — F4323 Adjustment disorder with mixed anxiety and depressed mood: Secondary | ICD-10-CM | POA: Insufficient documentation

## 2016-02-28 DIAGNOSIS — Z0001 Encounter for general adult medical examination with abnormal findings: Secondary | ICD-10-CM | POA: Diagnosis not present

## 2016-02-28 LAB — CBC WITH DIFFERENTIAL/PLATELET
BASOS PCT: 0.5 % (ref 0.0–3.0)
Basophils Absolute: 0 10*3/uL (ref 0.0–0.1)
EOS ABS: 0.4 10*3/uL (ref 0.0–0.7)
EOS PCT: 5.3 % — AB (ref 0.0–5.0)
HEMATOCRIT: 36 % (ref 36.0–46.0)
HEMOGLOBIN: 12 g/dL (ref 12.0–15.0)
Lymphocytes Relative: 39.7 % (ref 12.0–46.0)
Lymphs Abs: 3.3 10*3/uL (ref 0.7–4.0)
MCHC: 33.4 g/dL (ref 30.0–36.0)
MCV: 86 fl (ref 78.0–100.0)
MONO ABS: 0.9 10*3/uL (ref 0.1–1.0)
Monocytes Relative: 11.4 % (ref 3.0–12.0)
NEUTROS ABS: 3.5 10*3/uL (ref 1.4–7.7)
Neutrophils Relative %: 43.1 % (ref 43.0–77.0)
PLATELETS: 295 10*3/uL (ref 150.0–400.0)
RBC: 4.18 Mil/uL (ref 3.87–5.11)
RDW: 14 % (ref 11.5–15.5)
WBC: 8.2 10*3/uL (ref 4.0–10.5)

## 2016-02-28 LAB — LIPID PANEL
CHOL/HDL RATIO: 5
CHOLESTEROL: 202 mg/dL — AB (ref 0–200)
HDL: 37.7 mg/dL — ABNORMAL LOW (ref >39.00)
NONHDL: 163.95
TRIGLYCERIDES: 210 mg/dL — AB (ref 0.0–149.0)
VLDL: 42 mg/dL — AB (ref 0.0–40.0)

## 2016-02-28 LAB — COMPREHENSIVE METABOLIC PANEL
ALBUMIN: 3.7 g/dL (ref 3.5–5.2)
ALT: 13 U/L (ref 0–35)
AST: 18 U/L (ref 0–37)
Alkaline Phosphatase: 148 U/L — ABNORMAL HIGH (ref 39–117)
BUN: 10 mg/dL (ref 6–23)
CALCIUM: 9.5 mg/dL (ref 8.4–10.5)
CHLORIDE: 109 meq/L (ref 96–112)
CO2: 28 meq/L (ref 19–32)
Creatinine, Ser: 0.67 mg/dL (ref 0.40–1.20)
GFR: 95.49 mL/min (ref >60.00)
Glucose, Bld: 102 mg/dL — ABNORMAL HIGH (ref 70–99)
POTASSIUM: 4 meq/L (ref 3.5–5.1)
SODIUM: 141 meq/L (ref 135–145)
Total Bilirubin: 0.3 mg/dL (ref 0.2–1.2)
Total Protein: 6.5 g/dL (ref 6.0–8.3)

## 2016-02-28 LAB — T4, FREE: Free T4: 1.24 ng/dL (ref 0.60–1.60)

## 2016-02-28 LAB — LDL CHOLESTEROL, DIRECT: Direct LDL: 131 mg/dL

## 2016-02-28 LAB — TSH: TSH: 0.03 u[IU]/mL — ABNORMAL LOW (ref 0.35–4.50)

## 2016-02-28 MED ORDER — AMOXICILLIN 875 MG PO TABS: 875.0000 mg | ORAL_TABLET | Freq: Two times a day (BID) | ORAL | 0 refills | Status: DC

## 2016-02-28 NOTE — Progress Notes (Signed)
Subjective:   Patient ID: Lydia Carter, female    DOB: [birth year], 61 y.o.   MRN: [id number]  Lydia Carter is a pleasant 61 y.o. year old female who presents to clinic today with Annual Exam and Nasal Congestion  on 02/28/2016  HPI:  LMP over 10 years ago.  No h/o post menopausal bleeding. She is adopted and unsure of family history of cancers. Due for mammogram. Mammogram 03/14/15, she is scheduling her mammogram Pap smear done 09/12/15  Not willing to have a colonoscopy although daughter currently being treated for colon CA.  She thinks her insurance will not cover it.  She is not willing to do stool cards.  Denies changes in bowel habits or blood in her stool.  Sinus infection- 7- 10 days of sinus pressure.  Symptoms worsening.  No fevers. No cough.  Followed by Dr. Donneta Romberg as well.  Adjustment disorder- Deteriorated because her daughter is going through such a tough time. She plans to restart her buspar.  Has refills at home.  H/o hyperthyroidism-sees her endocrinologist on 03/10/2016 Lab Results  Component Value Date   TSH 0.08 (L) 09/13/2014   Current Outpatient Prescriptions on File Prior to Visit  Medication Sig Dispense Refill  . busPIRone (BUSPAR) 10 MG tablet Take 1 tablet (10 mg total) by mouth 2 (two) times daily. 60 tablet 2  . methimazole (TAPAZOLE) 5 MG tablet 1/2 tab by mouth every other day 30 tablet 0  . mometasone (NASONEX) 50 MCG/ACT nasal spray Place 2 sprays into the nose daily as needed.     No current facility-administered medications on file prior to visit.     Allergies  Allergen Reactions  . Atenolol     Made her "emotional and cry"  . Codeine     Past Medical History:  Diagnosis Date  . Allergy   . Arthritis   . GERD (gastroesophageal reflux disease)   . History of migraine headaches   . Hyperlipidemia   . Hyperthyroidism   . Tobacco abuse     Past Surgical History:  Procedure Laterality Date  . KNEE SURGERY    . TONSILECTOMY,  ADENOIDECTOMY, BILATERAL MYRINGOTOMY AND TUBES    . TUBAL LIGATION      Family History  Problem Relation Age of Onset  . Adopted: Yes    Social History   Social History  . Marital status: Married    Spouse name: N/A  . Number of children: N/A  . Years of education: N/A   Occupational History  . Not on file.   Social History Main Topics  . Smoking status: Former Smoker    Quit date: 08/18/2011  . Smokeless tobacco: Never Used  . Alcohol use Yes  . Drug use: No  . Sexual activity: Yes   Other Topics Concern  . Not on file   Social History Narrative  . No narrative on file   The PMH, PSH, Social History, Family History, Medications, and allergies have been reviewed in [facility], and have been updated if relevant.  Review of Systems  Constitutional: Negative for fever.  HENT: Positive for congestion, postnasal drip, rhinorrhea, sinus pain and sinus pressure.   Respiratory: Negative.   Cardiovascular: Negative.   Gastrointestinal: Negative.   Endocrine: Negative.   Genitourinary: Negative.   Musculoskeletal: Negative.   Allergic/Immunologic: Negative.   Neurological: Negative.   Hematological: Negative.   Psychiatric/Behavioral: Positive for sleep disturbance. Negative for self-injury and suicidal ideas. The patient is nervous/anxious.   All other  systems reviewed and are negative.      Objective:    BP 120/74   Pulse 88   Temp 98.2 F (36.8 C) (Oral)   Ht 5' 2.5" (1.588 m)   Wt 133 lb 4 oz (60.4 kg)   SpO2 97%   BMI 23.98 kg/m    Physical Exam   General:  Well-developed,well-nourished,in no acute distress; alert,appropriate and cooperative throughout examination Head:  normocephalic and atraumatic.   Eyes:  vision grossly intact, PERRL Ears:  R ear normal and L ear normal externally, TMs clear bilaterally Nose:  no external deformity.   Mouth:  good dentition.   Neck:  No deformities, masses, or tenderness noted. Breasts:  No mass, nodules, thickening,  tenderness, bulging, retraction, inflamation, nipple discharge or skin changes noted.   Lungs:  Normal respiratory effort, chest expands symmetrically. Lungs are clear to auscultation, no crackles or wheezes. Heart:  Normal rate and regular rhythm. S1 and S2 normal without gallop, murmur, click, rub or other extra sounds. Abdomen:  Bowel sounds positive,abdomen soft and non-tender without masses, organomegaly or hernias noted. Msk:  No deformity or scoliosis noted of thoracic or lumbar spine.   Extremities:  No clubbing, cyanosis, edema, or deformity noted with normal full range of motion of all joints.   Neurologic:  alert & oriented X3 and gait normal.   Skin:  Intact without suspicious lesions or rashes Cervical Nodes:  No lymphadenopathy noted Axillary Nodes:  No palpable lymphadenopathy Psych:  Cognition and judgment appear intact. Alert and cooperative with normal attention span and concentration. No apparent delusions, illusions, hallucinations       Assessment & Plan:   Encounter for general adult medical examination with abnormal findings  Well woman exam - Plan: CBC with Differential/Platelet, Comprehensive metabolic panel, Lipid panel, TSH  Hyperthyroidism - Plan: T4, Free  Adjustment disorder with mixed anxiety and depressed mood  Acute sinusitis, recurrence not specified, unspecified location No Follow-up on file.

## 2016-02-28 NOTE — Assessment & Plan Note (Signed)
Reviewed preventive care protocols, scheduled due services, and updated immunizations Discussed nutrition, exercise, diet, and healthy lifestyle.  Orders Placed This Encounter  Procedures  . CBC with Differential/Platelet  . Comprehensive metabolic panel  . Lipid panel  . TSH  . T4, Free    Declines IFOB and colonoscopy. She is scheduling her mammogram.

## 2016-02-28 NOTE — Assessment & Plan Note (Signed)
Restart buspar. Call or return to clinic prn if these symptoms worsen or fail to improve as anticipated.

## 2016-02-28 NOTE — Patient Instructions (Signed)
Great to see you. We are starting amoxicillin twice daily x 10 days. Please call me with an update.  We will call you with your lab results and you can view that online.

## 2016-02-28 NOTE — Assessment & Plan Note (Signed)
Given duration and progression of symptoms, will treat for bacterial sinusitis with amoxcillin. Continue nasal spray.

## 2016-03-13 ENCOUNTER — Telehealth: Payer: Self-pay | Admitting: Family Medicine

## 2016-03-13 NOTE — Telephone Encounter (Signed)
Pt returned call about labs  ° °

## 2016-03-13 NOTE — Telephone Encounter (Signed)
Patient returned Lydia Carter's call. °

## 2016-03-13 NOTE — Telephone Encounter (Signed)
See results note. 

## 2016-03-14 NOTE — Telephone Encounter (Signed)
Pt advised of results. 

## 2016-04-18 ENCOUNTER — Telehealth: Payer: Self-pay

## 2016-04-18 NOTE — Telephone Encounter (Signed)
[facility]; advised pt would need to be seen; pt said she is at work in [facility] until 5 pm; offered Sat Clinic but pt said would go to [facility].

## 2016-05-13 ENCOUNTER — Telehealth: Payer: Self-pay | Admitting: Family Medicine

## 2016-05-13 NOTE — Telephone Encounter (Signed)
Husband dropped off form for handicap card.  Please fill out and call when ready to pick up  Number is [phone]

## 2016-05-14 NOTE — Telephone Encounter (Signed)
Notified pt forms are ready to pick up front desk

## 2016-05-22 ENCOUNTER — Telehealth: Payer: Self-pay | Admitting: Family Medicine

## 2016-05-22 NOTE — Telephone Encounter (Signed)
Pt's husband dropped off forms for DMV disability placard. The one that was filled out before wasn't accepted since there was something scratched out on it. Left in RX tower.

## 2016-05-23 NOTE — Telephone Encounter (Signed)
Im not sure about the paper work but you can check the folder(yellow) where I made copies all paper work I fax/send when was working with Dr. Deborra Medina

## 2016-05-26 NOTE — Telephone Encounter (Signed)
Left VM on Mobile # to inform Paperwork for Handicap Placard is available for pick-up @ her convience.

## 2016-06-19 ENCOUNTER — Ambulatory Visit (INDEPENDENT_AMBULATORY_CARE_PROVIDER_SITE_OTHER): Payer: BC Managed Care – PPO | Admitting: Family Medicine

## 2016-06-19 ENCOUNTER — Encounter: Payer: Self-pay | Admitting: Family Medicine

## 2016-06-19 ENCOUNTER — Encounter (INDEPENDENT_AMBULATORY_CARE_PROVIDER_SITE_OTHER): Payer: Self-pay

## 2016-06-19 DIAGNOSIS — D72829 Elevated white blood cell count, unspecified: Secondary | ICD-10-CM | POA: Diagnosis not present

## 2016-06-19 DIAGNOSIS — M792 Neuralgia and neuritis, unspecified: Secondary | ICD-10-CM | POA: Diagnosis not present

## 2016-06-19 LAB — CBC WITH DIFFERENTIAL/PLATELET
BASOS PCT: 0.7 % (ref 0.0–3.0)
Basophils Absolute: 0.1 10*3/uL (ref 0.0–0.1)
EOS ABS: 0.2 10*3/uL (ref 0.0–0.7)
EOS PCT: 2.3 % (ref 0.0–5.0)
HCT: 40.3 % (ref 36.0–46.0)
Hemoglobin: 13.1 g/dL (ref 12.0–15.0)
LYMPHS ABS: 3.4 10*3/uL (ref 0.7–4.0)
Lymphocytes Relative: 35.8 % (ref 12.0–46.0)
MCHC: 32.6 g/dL (ref 30.0–36.0)
MCV: 89.4 fl (ref 78.0–100.0)
MONO ABS: 0.8 10*3/uL (ref 0.1–1.0)
Monocytes Relative: 8.1 % (ref 3.0–12.0)
NEUTROS ABS: 5.1 10*3/uL (ref 1.4–7.7)
NEUTROS PCT: 53.1 % (ref 43.0–77.0)
PLATELETS: 321 10*3/uL (ref 150.0–400.0)
RBC: 4.51 Mil/uL (ref 3.87–5.11)
RDW: 13.4 % (ref 11.5–15.5)
WBC: 9.5 10*3/uL (ref 4.0–10.5)

## 2016-06-19 NOTE — Progress Notes (Signed)
Pre visit review using our clinic review tool, if applicable. No additional management support is needed unless otherwise documented below in the visit note. 

## 2016-06-19 NOTE — Progress Notes (Signed)
Subjective:   Patient ID: Lydia Carter, female    DOB: 1955/10/04, 61 y.o.   MRN: [id number]  Lydia Carter is a pleasant 61 y.o. year old female who presents to clinic today with Eye Issues (Was seen by [facility] Bloodwork showed elevated WBC but no inflammation. )  on 06/19/2016  HPI:  Was seen by optho on 06/12/16, Dr. Rosana Hoes. Note reviewed.  Labs done to rule out TA- SED rate, CBC and CRP normal although pt was told wbc was elevated.  Dr. Loel Lofty feels that she may have reactivation of V2 shingles (had V2 zoster over 10 years ago), or post herpetic neuralgia as well as cervical spasm. Given Valtrex 1 gram three times daily and advised to follow up with me.  She already feels her pain has improved with Valtrex.  Also advised to increase steroid drops and continue artifical tears and vitamins.  Just finished zpack for sinusitis (Dr. Donneta Romberg). Current Outpatient Prescriptions on File Prior to Visit  Medication Sig Dispense Refill  . busPIRone (BUSPAR) 10 MG tablet Take 1 tablet (10 mg total) by mouth 2 (two) times daily. 60 tablet 2  . methimazole (TAPAZOLE) 5 MG tablet 1/2 tab by mouth every other day 30 tablet 0  . mometasone (NASONEX) 50 MCG/ACT nasal spray Place 2 sprays into the nose daily as needed.     No current facility-administered medications on file prior to visit.     Allergies  Allergen Reactions  . Atenolol     Made her "emotional and cry"  . Codeine     Past Medical History:  Diagnosis Date  . Allergy   . Arthritis   . GERD (gastroesophageal reflux disease)   . History of migraine headaches   . Hyperlipidemia   . Hyperthyroidism   . Tobacco abuse     Past Surgical History:  Procedure Laterality Date  . KNEE SURGERY    . TONSILECTOMY, ADENOIDECTOMY, BILATERAL MYRINGOTOMY AND TUBES    . TUBAL LIGATION      Family History  Problem Relation Age of Onset  . Adopted: Yes    Social History   Social History  . Marital status: Married    Spouse name:  N/A  . Number of children: N/A  . Years of education: N/A   Occupational History  . Not on file.   Social History Main Topics  . Smoking status: Former Smoker    Quit date: 08/18/2011  . Smokeless tobacco: Never Used  . Alcohol use Yes  . Drug use: No  . Sexual activity: Yes   Other Topics Concern  . Not on file   Social History Narrative  . No narrative on file   The PMH, PSH, Social History, Family History, Medications, and allergies have been reviewed in [facility], and have been updated if relevant.   Review of Systems  Constitutional: Negative.   HENT: Negative.   Eyes: Positive for pain and visual disturbance. Negative for photophobia and redness.  Neurological: Negative for tremors, facial asymmetry and weakness.  Psychiatric/Behavioral: Negative.   All other systems reviewed and are negative.      Objective:    BP 120/76 (BP Location: Left Arm, Patient Position: Sitting, Cuff Size: Normal)   Pulse 86   Temp 98.3 F (36.8 C) (Oral)   Wt 133 lb (60.3 kg)   SpO2 97%   BMI 23.94 kg/m    Physical Exam  Constitutional: She is oriented to person, place, and time. She appears well-developed and  well-nourished. No distress.  HENT:  Head: Normocephalic and atraumatic.  Eyes: Conjunctivae are normal.  Cardiovascular: Normal rate.   Pulmonary/Chest: Effort normal.  Musculoskeletal: Normal range of motion. She exhibits no edema.  Neurological: She is alert and oriented to person, place, and time. No cranial nerve deficit.  Skin: Skin is warm and dry. She is not diaphoretic.  Psychiatric: She has a normal mood and affect. Her behavior is normal. Judgment and thought content normal.  Nursing note and vitals reviewed.         Assessment & Plan:   Neuralgia  Leukocytosis, unspecified type - Plan: CBC with Differential/Platelet No Follow-up on file.

## 2016-06-19 NOTE — Assessment & Plan Note (Signed)
?   Trigeminal neuralgia vs reactivation of zoster. Pain is already "much better" with Valtrex. Advised to finish valtrex. Follow up with me if symptoms worsen again.

## 2016-06-19 NOTE — Patient Instructions (Signed)
Great to see you.  I will call you with your lab results.  Please come see in 2 weeks.

## 2016-06-19 NOTE — Assessment & Plan Note (Signed)
Likely multifactorial, she was also recently treated for bacterial sinusitis by her allergist. Repeat CBC today. The patient indicates understanding of these issues and agrees with the plan.

## 2016-07-03 ENCOUNTER — Ambulatory Visit (INDEPENDENT_AMBULATORY_CARE_PROVIDER_SITE_OTHER): Payer: BC Managed Care – PPO | Admitting: Family Medicine

## 2016-07-03 ENCOUNTER — Encounter: Payer: Self-pay | Admitting: Family Medicine

## 2016-07-03 VITALS — BP 110/80 | HR 80 | Ht 63.0 in | Wt 137.0 lb

## 2016-07-03 DIAGNOSIS — M792 Neuralgia and neuritis, unspecified: Secondary | ICD-10-CM | POA: Diagnosis not present

## 2016-07-03 MED ORDER — VALACYCLOVIR HCL 1 G PO TABS: 1000.0000 mg | ORAL_TABLET | Freq: Three times a day (TID) | ORAL | 3 refills | Status: AC

## 2016-07-03 MED ORDER — ONDANSETRON HCL 4 MG PO TABS: 4.0000 mg | ORAL_TABLET | Freq: Three times a day (TID) | ORAL | 0 refills | Status: DC | PRN

## 2016-07-03 NOTE — Assessment & Plan Note (Signed)
Deteriorated. Will increase valtrex back to 1 gram three times daily for several more weeks and then taper down to Valtrex 1 gram twice daily rather than daily for a few more weeks. She will follow up with me at that time. The patient indicates understanding of these issues and agrees with the plan.

## 2016-07-03 NOTE — Progress Notes (Signed)
Subjective:   Patient ID: Lydia Carter, female    DOB: 1955/10/25, 61 y.o.   MRN: [id number]  Lydia Carter is a pleasant 61 y.o. year old female who presents to clinic today with Follow-up  on 07/03/2016  HPI:  Here for two week follow up.  Saw her on 06/19/16 for monitoring of probable reactivation of V2 shingles vs PHN.  Symptoms started to return again now that she is taking Valtrex 1000 mg daily rather than 3 times daily. Having more facial and ear pain.  Leukocytosis has resolved. Lab Results  Component Value Date   WBC 9.5 06/19/2016   HGB 13.1 06/19/2016   HCT 40.3 06/19/2016   MCV 89.4 06/19/2016   PLT 321.0 06/19/2016     Current Outpatient Prescriptions on File Prior to Visit  Medication Sig Dispense Refill  . busPIRone (BUSPAR) 10 MG tablet Take 1 tablet (10 mg total) by mouth 2 (two) times daily. 60 tablet 2  . loteprednol (LOTEMAX) 0.2 % SUSP Apply 1 drop to eye 4 (four) times daily.    . methimazole (TAPAZOLE) 5 MG tablet 1/2 tab by mouth every other day 30 tablet 0  . mometasone (NASONEX) 50 MCG/ACT nasal spray Place 2 sprays into the nose daily as needed.    . valACYclovir (VALTREX) 1000 MG tablet Take 1,000 mg by mouth daily. Take 1 by mouth 3 times a day for 14 days. Then 1 tablet daily until complete.     No current facility-administered medications on file prior to visit.     Allergies  Allergen Reactions  . Atenolol     Made her "emotional and cry"  . Codeine     Past Medical History:  Diagnosis Date  . Allergy   . Arthritis   . GERD (gastroesophageal reflux disease)   . History of migraine headaches   . Hyperlipidemia   . Hyperthyroidism   . Tobacco abuse     Past Surgical History:  Procedure Laterality Date  . KNEE SURGERY    . TONSILECTOMY, ADENOIDECTOMY, BILATERAL MYRINGOTOMY AND TUBES    . TUBAL LIGATION      Family History  Problem Relation Age of Onset  . Adopted: Yes    Social History   Social History  . Marital  status: Married    Spouse name: N/A  . Number of children: N/A  . Years of education: N/A   Occupational History  . Not on file.   Social History Main Topics  . Smoking status: Former Smoker    Quit date: 08/18/2011  . Smokeless tobacco: Never Used  . Alcohol use Yes  . Drug use: No  . Sexual activity: Yes   Other Topics Concern  . Not on file   Social History Narrative  . No narrative on file   The PMH, PSH, Social History, Family History, Medications, and allergies have been reviewed in [facility], and have been updated if relevant.   Review of Systems  Constitutional: Negative.   HENT:       +right facial pain + right ear pain  Neurological: Positive for numbness.  Psychiatric/Behavioral: Negative.   All other systems reviewed and are negative.      Objective:    BP 110/80   Pulse 80   Ht 5\' 3"  (1.6 m)   Wt 137 lb (62.1 kg)   SpO2 98%   BMI 24.27 kg/m    Physical Exam  Constitutional: She is oriented to person, place, and time. She appears  well-developed and well-nourished. No distress.  HENT:  Head: Normocephalic and atraumatic.  Eyes: Conjunctivae are normal.  Cardiovascular: Normal rate.   Pulmonary/Chest: Effort normal.  Musculoskeletal: Normal range of motion.  Neurological: She is alert and oriented to person, place, and time. No cranial nerve deficit.  Skin: Skin is warm and dry. She is not diaphoretic.  Psychiatric: She has a normal mood and affect. Her behavior is normal. Judgment and thought content normal.  Nursing note reviewed.         Assessment & Plan:   Neuralgia No Follow-up on file.

## 2016-07-03 NOTE — Progress Notes (Signed)
Pre visit review using our clinic review tool, if applicable. No additional management support is needed unless otherwise documented below in the visit note. 

## 2016-11-05 ENCOUNTER — Other Ambulatory Visit: Payer: Self-pay | Admitting: Internal Medicine

## 2016-11-06 NOTE — Telephone Encounter (Signed)
Last CPE 02/2016 and acute OV 06/2016... Rx last filled 09/2015... Please advise

## 2017-01-12 ENCOUNTER — Ambulatory Visit: Payer: BC Managed Care – PPO | Admitting: Internal Medicine

## 2017-01-12 ENCOUNTER — Encounter: Payer: Self-pay | Admitting: Internal Medicine

## 2017-01-12 VITALS — BP 130/70 | HR 90 | Temp 98.2°F | Wt 140.0 lb

## 2017-01-12 DIAGNOSIS — J302 Other seasonal allergic rhinitis: Secondary | ICD-10-CM

## 2017-01-12 DIAGNOSIS — K219 Gastro-esophageal reflux disease without esophagitis: Secondary | ICD-10-CM

## 2017-01-12 DIAGNOSIS — B0229 Other postherpetic nervous system involvement: Secondary | ICD-10-CM

## 2017-01-12 DIAGNOSIS — E7800 Pure hypercholesterolemia, unspecified: Secondary | ICD-10-CM | POA: Diagnosis not present

## 2017-01-12 DIAGNOSIS — M1990 Unspecified osteoarthritis, unspecified site: Secondary | ICD-10-CM

## 2017-01-12 DIAGNOSIS — G43B0 Ophthalmoplegic migraine, not intractable: Secondary | ICD-10-CM

## 2017-01-12 DIAGNOSIS — F4323 Adjustment disorder with mixed anxiety and depressed mood: Secondary | ICD-10-CM

## 2017-01-12 DIAGNOSIS — E0590 Thyrotoxicosis, unspecified without thyrotoxic crisis or storm: Secondary | ICD-10-CM | POA: Diagnosis not present

## 2017-01-12 DIAGNOSIS — E785 Hyperlipidemia, unspecified: Secondary | ICD-10-CM | POA: Insufficient documentation

## 2017-01-12 LAB — TSH: TSH: <0.01 u[IU]/mL — ABNORMAL LOW (ref 0.35–4.50)

## 2017-01-12 LAB — T4, FREE: FREE T4: 1.68 ng/dL — AB (ref 0.60–1.60)

## 2017-01-12 NOTE — Assessment & Plan Note (Signed)
Will check TSH and Free T4 today Referral to endo placed Continue Tapazole for now

## 2017-01-12 NOTE — Assessment & Plan Note (Signed)
She reports this is controlled on daily Acyclovir although not on her med list She denies ever taking Gabapentin for the same

## 2017-01-12 NOTE — Assessment & Plan Note (Signed)
Diet controlled Will check lipid profile at annual exam

## 2017-01-12 NOTE — Assessment & Plan Note (Signed)
Avoid triggers Continue Ranitidine prn

## 2017-01-12 NOTE — Assessment & Plan Note (Signed)
Controlled on Nasonex

## 2017-01-12 NOTE — Assessment & Plan Note (Signed)
Chronic Controlled with Aleve and Zofran  Will monitor

## 2017-01-12 NOTE — Assessment & Plan Note (Signed)
Chronic but stable on Buspar Support offered today.

## 2017-01-12 NOTE — Patient Instructions (Signed)
Gastroesophageal Reflux Scan A gastroesophageal reflux scan is a procedure that is used to check for gastroesophageal reflux, which is the backward flow of stomach contents into the tube that carries food from the mouth to the stomach (esophagus). The scan can also show if any stomach contents are inhaled (aspirated) into your lungs. You may need this scan if you have symptoms such as heartburn, vomiting, swallowing problems, or regurgitation. Regurgitation means that swallowed food is returning from the stomach to the esophagus. For this scan, you will drink a liquid that contains a small amount of a radioactive substance (tracer). A scanner with a camera that detects the radioactive tracer is used to see if any of the material backs up into your esophagus. Tell a health care provider about:  Any allergies you have.  All medicines you are taking, including vitamins, herbs, eye drops, creams, and over-the-counter medicines.  Any blood disorders you have.  Any surgeries you have had.  Any medical conditions you have.  If you are pregnant or you think that you may be pregnant.  If you are breastfeeding. What are the risks? Generally, this is a safe procedure. However, problems may occur, including:  Exposure to radiation (a small amount).  Allergic reaction to the radioactive substance. This is rare.  What happens before the procedure?  Ask your health care provider about changing or stopping your regular medicines. This is especially important if you are taking diabetes medicines or blood thinners.  Follow your health care provider's instructions about eating or drinking restrictions. What happens during the procedure?  You will be asked to drink a liquid that contains a small amount of a radioactive tracer. This liquid will probably be similar to orange juice.  You will assume a position lying on your back.  A series of images will be taken of your esophagus and upper  stomach.  You may be asked to move into different positions to help determine if reflux occurs more often when you are in specific positions.  For adults, an abdominal binder with an inflatable cuff may be placed on the belly (abdomen). This may be used to increase abdominal pressure. More images will be taken to see if the increased pressure causes reflux to occur. The procedure may vary among health care providers and hospitals. What happens after the procedure?  Return to your normal activities and your normal diet as directed by your health care provider.  The radioactive tracer will leave your body over the next few days. Drink enough fluid to keep your urine clear or pale yellow. This will help to flush the tracer out of your body.  It is your responsibility to obtain your test results. Ask your health care provider or the department performing the test when and how you will get your results. This information is not intended to replace advice given to you by your health care provider. Make sure you discuss any questions you have with your health care provider. Document Released: 03/27/2005 Document Revised: 10/29/2015 Document Reviewed: 11/15/2013 Elsevier Interactive Patient Education  2018 [employer]  

## 2017-01-12 NOTE — Assessment & Plan Note (Signed)
Controlled with Tylenol Arthritis Will monitor

## 2017-01-12 NOTE — Progress Notes (Signed)
HPI  Pt presents to the clinic today to establish care and for management of the conditions listed below. She is transferring care from Dr. Deborra Medina.  Seasonal Allergies: Worse in the spring and fall. She takes Nasonex as needed with good relief.  Post Herpetic Neuralgia: Controlled with Acyclovir although she does not know why this is on her list. She feels like the pain is controlled with Acyclovir.  Arthritis: Mainly in her fingers and knees. She takes Tylenol Arthritis as needed with good relief. She has tried Meloxicam in the past but she reports this causes epigastric pain.  GERD: Triggered by eating and laying down. She takes Ranitidine as needed with good relief.   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 She takes Advil and Zofran with great relief.  HLD: Her last LDL was 131, 02/2016. She is not taking any cholesterol lowering medication at this time. She consumes a low fat diet.  Hyperthryoidism: She is taking Tapazole daily. She sees Dr. Honor Junes, but reports she needs a new referral because he has changed locations. She is due to have her labs repeated today.  Anxiety: Triggered by stress. She takes Buspar as prescribed with good relief.  Flu: 11/2016, at work Tetanus: 06/2010 Shingles Vaccine: 06/2010 Pap Smear: 08/2014 Mammogram: 02/2015 Colon Screening: unsure Vision Screening: annually Dentist: as needed   Past Medical History:  Diagnosis Date  . Allergy   . Arthritis   . GERD (gastroesophageal reflux disease)   . History of migraine headaches   . Hyperlipidemia   . Hyperthyroidism   . Tobacco abuse     Current Outpatient Medications  Medication Sig Dispense Refill  . busPIRone (BUSPAR) 10 MG tablet TAKE ONE TABLET BY MOUTH TWICE DAILY 60 tablet 3  . loteprednol (LOTEMAX) 0.2 % SUSP Apply 1 drop to eye 4 (four) times daily.    . methimazole (TAPAZOLE) 5 MG tablet 1/2 tab by mouth every other day 30 tablet 0  . mometasone (NASONEX) 50 MCG/ACT nasal spray  Place 2 sprays into the nose daily as needed.    . ondansetron (ZOFRAN) 4 MG tablet Take 1 tablet (4 mg total) by mouth every 8 (eight) hours as needed for nausea or vomiting. 20 tablet 0   No current facility-administered medications for this visit.     Allergies  Allergen Reactions  . Atenolol     Made her "emotional and cry"  . Codeine     Family History  Adopted: Yes    Social History   Socioeconomic History  . Marital status: Married    Spouse name: Not on file  . Number of children: Not on file  . Years of education: Not on file  . Highest education level: Not on file  Social Needs  . Financial resource strain: Not on file  . Food insecurity - worry: Not on file  . Food insecurity - inability: Not on file  . Transportation needs - medical: Not on file  . Transportation needs - non-medical: Not on file  Occupational History  . Not on file  Tobacco Use  . Smoking status: Former Smoker    Last attempt to quit: 08/18/2011    Years since quitting: 5.4  . Smokeless tobacco: Never Used  Substance and Sexual Activity  . Alcohol use: Yes  . Drug use: No  . Sexual activity: Yes  Other Topics Concern  . Not on file  Social History Narrative  . Not on file    ROS:  Constitutional:  Denies fever, malaise, fatigue, headache or abrupt weight changes.  HEENT: Denies eye pain, eye redness, ear pain, ringing in the ears, wax buildup, runny nose, nasal congestion, bloody nose, or sore throat. Respiratory: Denies difficulty breathing, shortness of breath, cough or sputum production.   Cardiovascular: Denies chest pain, chest tightness, palpitations or swelling in the hands or feet.  Gastrointestinal: Pt reports intermittent reflux. Denies abdominal pain, bloating, constipation, diarrhea or blood in the stool.  GU: Denies frequency, urgency, pain with urination, blood in urine, odor or discharge. Musculoskeletal: Pt reports joint pain. Denies decrease in range of motion,  difficulty with gait, muscle pain or joint swelling.  Skin: Denies redness, rashes, lesions or ulcercations.  Neurological: Denies dizziness, difficulty with memory, difficulty with speech or problems with balance and coordination.  Psych: Pt reports anxiety. Denies depression, SI/HI.  No other specific complaints in a complete review of systems (except as listed in HPI above).  PE:  BP 130/70   Pulse 90   Temp 98.2 F (36.8 C) (Oral)   Wt 140 lb (63.5 kg)   SpO2 96%   BMI 24.80 kg/m   Wt Readings from Last 3 Encounters:  07/03/16 137 lb (62.1 kg)  06/19/16 133 lb (60.3 kg)  02/28/16 133 lb 4 oz (60.4 kg)    General: Appears her stated age, well developed, well nourished in NAD. HEENT:  Ears: Tm's gray and intact, normal light reflex;Throat/Mouth: Teeth present, mucosa pink and moist, no lesions or ulcerations noted.  Neck: Neck supple, trachea midline. No masses, lumps present.  Cardiovascular: Normal rate and rhythm. S1,S2 noted.  No murmur, rubs or gallops noted.  Pulmonary/Chest: Normal effort and positive vesicular breath sounds. No respiratory distress. No wheezes, rales or ronchi noted.  Abdomen: Soft and nontender. Normal bowel sounds. Neurological: Alert and oriented. Psychiatric: Mood and affect normal. Behavior is normal. Judgment and thought content normal.     BMET    Component Value Date/Time   NA 141 02/28/2016 0814   K 4.0 02/28/2016 0814   CL 109 02/28/2016 0814   CO2 28 02/28/2016 0814   GLUCOSE 102 (H) 02/28/2016 0814   BUN 10 02/28/2016 0814   CREATININE 0.67 02/28/2016 0814   CALCIUM 9.5 02/28/2016 0814    Lipid Panel     Component Value Date/Time   CHOL 202 (H) 02/28/2016 0814   TRIG 210.0 (H) 02/28/2016 0814   HDL 37.70 (L) 02/28/2016 0814   CHOLHDL 5 02/28/2016 0814   VLDL 42.0 (H) 02/28/2016 0814   LDLCALC 136 (H) 07/25/2014 1145    CBC    Component Value Date/Time   WBC 9.5 06/19/2016 0757   RBC 4.51 06/19/2016 0757   HGB 13.1  06/19/2016 0757   HCT 40.3 06/19/2016 0757   PLT 321.0 06/19/2016 0757   MCV 89.4 06/19/2016 0757   MCHC 32.6 06/19/2016 0757   RDW 13.4 06/19/2016 0757   LYMPHSABS 3.4 06/19/2016 0757   MONOABS 0.8 06/19/2016 0757   EOSABS 0.2 06/19/2016 0757   BASOSABS 0.1 06/19/2016 0757    Hgb A1C No results found for: HGBA1C   Assessment and Plan:

## 2017-02-02 ENCOUNTER — Other Ambulatory Visit: Payer: Self-pay | Admitting: Family Medicine

## 2017-02-02 NOTE — Telephone Encounter (Signed)
RB-Plz see refill req/ths dmf

## 2017-03-02 ENCOUNTER — Encounter: Payer: BC Managed Care – PPO | Admitting: Internal Medicine

## 2017-03-03 ENCOUNTER — Telehealth: Payer: Self-pay | Admitting: Internal Medicine

## 2017-03-03 ENCOUNTER — Ambulatory Visit: Payer: Self-pay

## 2017-03-03 NOTE — Telephone Encounter (Signed)
Call returned to pt. with c/o sinus infection; requesting "to have something called in for infection."  Per Triage assess., the pt. stated she felt like she was getting sick on Saturday night.  Reported the pain started in the forehead and around her eyes last night.  Reported nasal congestion with clear to green mucus.  Reported she coughs mainly in the morning from the drainage in her throat.  Denied feeling any chest congestion.  Unsure if she has a fever.  Reported she has had some episodes of feeling warm, while at work.  Reported symptoms as "mild to moderate."  Stated "my head feels like it is in a vice"; rated pain at 4-6/10.  Stated she has felt worse, as the day has progressed. Is requesting to get medication started before she gets worse. Reported she has been using Sinus Decongestant., Nasonex, and Tylenol for her symptoms. Advised the protocol recommends to go to UC within 4 hours for severe pain, and to be seen within 24 hrs for sinus pain and fever.  The pt. Stated she was in [place], and will go to the UC close to her work.  Offered to look for an appt. This afternoon with her PCP.  Verb. that she didn't want to come to [facility], as she is 1.5 hours away.  Advised pt. Will send this note to [facility].  Agreed with plan.         Reason for Disposition . [1] SEVERE pain AND [2] not improved 2 hours after pain medicine  Answer Assessment - Initial Assessment Questions 1. LOCATION: "Where does it hurt?"      Pain in forehead and around eyes 2. ONSET: "When did the sinus pain start?"  (e.g., hours, days)     Last night 3. SEVERITY: "How bad is the pain?"   (Scale 1-10; mild, moderate or severe)   - MILD (1-3): doesn't interfere with normal activities    - MODERATE (4-7): interferes with normal activities (e.g., work or school) or awakens from sleep   - SEVERE (8-10): excruciating pain and patient unable to do any normal activities        Mild to moderate 4. RECURRENT SYMPTOM: "Have  you ever had sinus problems before?" If so, ask: "When was the last time?" and "What happened that time?"      Yes; it's been 6-8 mos.  5. NASAL CONGESTION: "Is the nose blocked?" If so, ask, "Can you open it or must you breathe through the mouth?"     Nasal congestion 6. NASAL DISCHARGE: "Do you have discharge from your nose?" If so ask, "What color?"     Clear to green 7. FEVER: "Do you have a fever?" If so, ask: "What is it, how was it measured, and when did it start?"      Unsure; feels hot at work 8. OTHER SYMPTOMS: "Do you have any other symptoms?" (e.g., sore throat, cough, earache, difficulty breathing)     Cough early morning with the post nasal drainage; denied SOB; denied earache. 9. PREGNANCY: "Is there any chance you are pregnant?" "When was your last menstrual period?"     No  Protocols used: SINUS PAIN OR CONGESTION-A-AH

## 2017-03-03 NOTE — Telephone Encounter (Signed)
See Triage note for symptoms of sinus infection

## 2017-03-03 NOTE — Telephone Encounter (Unsigned)
Copied from [place] [id number]. Topic: Inquiry >> Mar 03, 2017  1:13 PM Neva Seat wrote: Pt has been having a sinus infection since Sat night.  Pt is wanting to know if  something can be called in for her to clear the infection up.  Pt has an appt w/ doctor on Mon. 21st.  [facility] - Phone: [phone] Fax: [phone]

## 2017-03-09 ENCOUNTER — Encounter: Payer: Self-pay | Admitting: Internal Medicine

## 2017-03-09 ENCOUNTER — Ambulatory Visit (INDEPENDENT_AMBULATORY_CARE_PROVIDER_SITE_OTHER): Payer: BC Managed Care – PPO | Admitting: Internal Medicine

## 2017-03-09 VITALS — BP 128/80 | Ht 62.5 in | Wt 133.0 lb

## 2017-03-09 DIAGNOSIS — Z114 Encounter for screening for human immunodeficiency virus [HIV]: Secondary | ICD-10-CM | POA: Diagnosis not present

## 2017-03-09 DIAGNOSIS — Z1159 Encounter for screening for other viral diseases: Secondary | ICD-10-CM | POA: Diagnosis not present

## 2017-03-09 DIAGNOSIS — R0989 Other specified symptoms and signs involving the circulatory and respiratory systems: Secondary | ICD-10-CM | POA: Diagnosis not present

## 2017-03-09 DIAGNOSIS — Z0001 Encounter for general adult medical examination with abnormal findings: Secondary | ICD-10-CM | POA: Diagnosis not present

## 2017-03-09 LAB — COMPREHENSIVE METABOLIC PANEL
ALT: 13 U/L (ref 0–35)
AST: 18 U/L (ref 0–37)
Albumin: 3.8 g/dL (ref 3.5–5.2)
Alkaline Phosphatase: 112 U/L (ref 39–117)
BUN: 11 mg/dL (ref 6–23)
CHLORIDE: 105 meq/L (ref 96–112)
CO2: 26 mEq/L (ref 19–32)
Calcium: 9.5 mg/dL (ref 8.4–10.5)
Creatinine, Ser: 0.71 mg/dL (ref 0.40–1.20)
GFR: 89 mL/min (ref >60.00)
GLUCOSE: 91 mg/dL (ref 70–99)
POTASSIUM: 4 meq/L (ref 3.5–5.1)
SODIUM: 139 meq/L (ref 135–145)
Total Bilirubin: 0.4 mg/dL (ref 0.2–1.2)
Total Protein: 6.6 g/dL (ref 6.0–8.3)

## 2017-03-09 LAB — CBC
HEMATOCRIT: 38.8 % (ref 36.0–46.0)
Hemoglobin: 12.7 g/dL (ref 12.0–15.0)
MCHC: 32.7 g/dL (ref 30.0–36.0)
MCV: 87.6 fl (ref 78.0–100.0)
Platelets: 264 10*3/uL (ref 150.0–400.0)
RBC: 4.43 Mil/uL (ref 3.87–5.11)
RDW: 13.3 % (ref 11.5–15.5)
WBC: 10.2 10*3/uL (ref 4.0–10.5)

## 2017-03-09 LAB — VITAMIN D 25 HYDROXY (VIT D DEFICIENCY, FRACTURES): VITD: 11.78 ng/mL — AB (ref 30.00–100.00)

## 2017-03-09 LAB — LIPID PANEL
CHOL/HDL RATIO: 5
Cholesterol: 180 mg/dL (ref 0–200)
HDL: 37.2 mg/dL — ABNORMAL LOW (ref >39.00)
LDL CALC: 116 mg/dL — AB (ref 0–99)
NONHDL: 142.92
Triglycerides: 136 mg/dL (ref 0.0–149.0)
VLDL: 27.2 mg/dL (ref 0.0–40.0)

## 2017-03-09 NOTE — Patient Instructions (Signed)
Health Maintenance for Postmenopausal Women Menopause is a normal process in which your reproductive ability comes to an end. This process happens gradually over a span of months to years, usually between the ages of 22 and 9. Menopause is complete when you have missed 12 consecutive menstrual periods. It is important to talk with your health care provider about some of the most common conditions that affect postmenopausal women, such as heart disease, cancer, and bone loss (osteoporosis). Adopting a healthy lifestyle and getting preventive care can help to promote your health and wellness. Those actions can also lower your chances of developing some of these common conditions. What should I know about menopause? During menopause, you may experience a number of symptoms, such as:  Moderate-to-severe hot flashes.  Night sweats.  Decrease in sex drive.  Mood swings.  Headaches.  Tiredness.  Irritability.  Memory problems.  Insomnia.  Choosing to treat or not to treat menopausal changes is an individual decision that you make with your health care provider. What should I know about hormone replacement therapy and supplements? Hormone therapy products are effective for treating symptoms that are associated with menopause, such as hot flashes and night sweats. Hormone replacement carries certain risks, especially as you become older. If you are thinking about using estrogen or estrogen with progestin treatments, discuss the benefits and risks with your health care provider. What should I know about heart disease and stroke? Heart disease, heart attack, and stroke become more likely as you age. This may be due, in part, to the hormonal changes that your body experiences during menopause. These can affect how your body processes dietary fats, triglycerides, and cholesterol. Heart attack and stroke are both medical emergencies. There are many things that you can do to help prevent heart disease  and stroke:  Have your blood pressure checked at least every 1-2 years. High blood pressure causes heart disease and increases the risk of stroke.  If you are 53-22 years old, ask your health care provider if you should take aspirin to prevent a heart attack or a stroke.  Do not use any tobacco products, including cigarettes, chewing tobacco, or electronic cigarettes. If you need help quitting, ask your health care provider.  It is important to eat a healthy diet and maintain a healthy weight. ? Be sure to include plenty of vegetables, fruits, low-fat dairy products, and lean protein. ? Avoid eating foods that are high in solid fats, added sugars, or salt (sodium).  Get regular exercise. This is one of the most important things that you can do for your health. ? Try to exercise for at least 150 minutes each week. The type of exercise that you do should increase your heart rate and make you sweat. This is known as moderate-intensity exercise. ? Try to do strengthening exercises at least twice each week. Do these in addition to the moderate-intensity exercise.  Know your numbers.Ask your health care provider to check your cholesterol and your blood glucose. Continue to have your blood tested as directed by your health care provider.  What should I know about cancer screening? There are several types of cancer. Take the following steps to reduce your risk and to catch any cancer development as early as possible. Breast Cancer  Practice breast self-awareness. ? This means understanding how your breasts normally appear and feel. ? It also means doing regular breast self-exams. Let your health care provider know about any changes, no matter how small.  If you are 40  or older, have a clinician do a breast exam (clinical breast exam or CBE) every year. Depending on your age, family history, and medical history, it may be recommended that you also have a yearly breast X-ray (mammogram).  If you  have a family history of breast cancer, talk with your health care provider about genetic screening.  If you are at high risk for breast cancer, talk with your health care provider about having an MRI and a mammogram every year.  Breast cancer (BRCA) gene test is recommended for women who have family members with BRCA-related cancers. Results of the assessment will determine the need for genetic counseling and BRCA1 and for BRCA2 testing. BRCA-related cancers include these types: ? Breast. This occurs in males or females. ? Ovarian. ? Tubal. This may also be called fallopian tube cancer. ? Cancer of the abdominal or pelvic lining (peritoneal cancer). ? Prostate. ? Pancreatic.  Cervical, Uterine, and Ovarian Cancer Your health care provider may recommend that you be screened regularly for cancer of the pelvic organs. These include your ovaries, uterus, and vagina. This screening involves a pelvic exam, which includes checking for microscopic changes to the surface of your cervix (Pap test).  For women ages 21-65, health care providers may recommend a pelvic exam and a Pap test every three years. For women ages 79-65, they may recommend the Pap test and pelvic exam, combined with testing for human papilloma virus (HPV), every five years. Some types of HPV increase your risk of cervical cancer. Testing for HPV may also be done on women of any age who have unclear Pap test results.  Other health care providers may not recommend any screening for nonpregnant women who are considered low risk for pelvic cancer and have no symptoms. Ask your health care provider if a screening pelvic exam is right for you.  If you have had past treatment for cervical cancer or a condition that could lead to cancer, you need Pap tests and screening for cancer for at least 20 years after your treatment. If Pap tests have been discontinued for you, your risk factors (such as having a new sexual partner) need to be  reassessed to determine if you should start having screenings again. Some women have medical problems that increase the chance of getting cervical cancer. In these cases, your health care provider may recommend that you have screening and Pap tests more often.  If you have a family history of uterine cancer or ovarian cancer, talk with your health care provider about genetic screening.  If you have vaginal bleeding after reaching menopause, tell your health care provider.  There are currently no reliable tests available to screen for ovarian cancer.  Lung Cancer Lung cancer screening is recommended for adults 69-62 years old who are at high risk for lung cancer because of a history of smoking. A yearly low-dose CT scan of the lungs is recommended if you:  Currently smoke.  Have a history of at least 30 pack-years of smoking and you currently smoke or have quit within the past 15 years. A pack-year is smoking an average of one pack of cigarettes per day for one year.  Yearly screening should:  Continue until it has been 15 years since you quit.  Stop if you develop a health problem that would prevent you from having lung cancer treatment.  Colorectal Cancer  This type of cancer can be detected and can often be prevented.  Routine colorectal cancer screening usually begins at  age 42 and continues through age 45.  If you have risk factors for colon cancer, your health care provider may recommend that you be screened at an earlier age.  If you have a family history of colorectal cancer, talk with your health care provider about genetic screening.  Your health care provider may also recommend using home test kits to check for hidden blood in your stool.  A small camera at the end of a tube can be used to examine your colon directly (sigmoidoscopy or colonoscopy). This is done to check for the earliest forms of colorectal cancer.  Direct examination of the colon should be repeated every  5-10 years until age 71. However, if early forms of precancerous polyps or small growths are found or if you have a family history or genetic risk for colorectal cancer, you may need to be screened more often.  Skin Cancer  Check your skin from head to toe regularly.  Monitor any moles. Be sure to tell your health care provider: ? About any new moles or changes in moles, especially if there is a change in a mole's shape or color. ? If you have a mole that is larger than the size of a pencil eraser.  If any of your family members has a history of skin cancer, especially at a young age, talk with your health care provider about genetic screening.  Always use sunscreen. Apply sunscreen liberally and repeatedly throughout the day.  Whenever you are outside, protect yourself by wearing long sleeves, pants, a wide-brimmed hat, and sunglasses.  What should I know about osteoporosis? Osteoporosis is a condition in which bone destruction happens more quickly than new bone creation. After menopause, you may be at an increased risk for osteoporosis. To help prevent osteoporosis or the bone fractures that can happen because of osteoporosis, the following is recommended:  If you are 46-71 years old, get at least 1,000 mg of calcium and at least 600 mg of vitamin D per day.  If you are older than age 55 but younger than age 65, get at least 1,200 mg of calcium and at least 600 mg of vitamin D per day.  If you are older than age 54, get at least 1,200 mg of calcium and at least 800 mg of vitamin D per day.  Smoking and excessive alcohol intake increase the risk of osteoporosis. Eat foods that are rich in calcium and vitamin D, and do weight-bearing exercises several times each week as directed by your health care provider. What should I know about how menopause affects my mental health? Depression may occur at any age, but it is more common as you become older. Common symptoms of depression  include:  Low or sad mood.  Changes in sleep patterns.  Changes in appetite or eating patterns.  Feeling an overall lack of motivation or enjoyment of activities that you previously enjoyed.  Frequent crying spells.  Talk with your health care provider if you think that you are experiencing depression. What should I know about immunizations? It is important that you get and maintain your immunizations. These include:  Tetanus, diphtheria, and pertussis (Tdap) booster vaccine.  Influenza every year before the flu season begins.  Pneumonia vaccine.  Shingles vaccine.  Your health care provider may also recommend other immunizations. This information is not intended to replace advice given to you by your health care provider. Make sure you discuss any questions you have with your health care provider. Document Released: 03/28/2005  Document Revised: 08/24/2015 Document Reviewed: 11/07/2014 Elsevier Interactive Patient Education  2018 [employer]  

## 2017-03-09 NOTE — Progress Notes (Signed)
Subjective:    Patient ID: Lydia Carter, female    DOB: 1955/09/24, 62 y.o.   MRN: [id number]  HPI  Pt presents to the clinic today for her annual exam.  Flu: 10/2015 Tetanus: 06/2010 Pneumovax: 05/2009 Zostovax: 06/2010 Shingrix: never Mammogram: 02/2015 Pap Smear: 08/2014 Bone Density: 07/2010 Colon Screening: never Vision Screening: annually Dentist: annually  Diet: She does eat meat. She consumes fruits and veggies daily. She avoids fried foods. She drinks mostly water. Exercise: None  Review of Systems      Past Medical History:  Diagnosis Date  . Allergy   . Arthritis   . GERD (gastroesophageal reflux disease)   . History of migraine headaches   . Hyperlipidemia   . Hyperthyroidism   . Tobacco abuse     Current Outpatient Medications  Medication Sig Dispense Refill  . busPIRone (BUSPAR) 10 MG tablet TAKE ONE TABLET BY MOUTH TWICE DAILY 60 tablet 3  . loteprednol (LOTEMAX) 0.2 % SUSP Apply 1 drop to eye 4 (four) times daily.    . methimazole (TAPAZOLE) 5 MG tablet 1/2 tab by mouth every other day 30 tablet 0  . mometasone (NASONEX) 50 MCG/ACT nasal spray Place 2 sprays into the nose daily as needed.    . ondansetron (ZOFRAN) 4 MG tablet TAKE ONE TABLET BY MOUTH EVERY EIGHT HOURS AS NEEDED FOR NAUSEA OR VOMITING 20 tablet 0   No current facility-administered medications for this visit.     Allergies  Allergen Reactions  . Atenolol     Made her "emotional and cry"  . Codeine     Family History  Adopted: Yes    Social History   Socioeconomic History  . Marital status: Married    Spouse name: Not on file  . Number of children: Not on file  . Years of education: Not on file  . Highest education level: Not on file  Social Needs  . Financial resource strain: Not on file  . Food insecurity - worry: Not on file  . Food insecurity - inability: Not on file  . Transportation needs - medical: Not on file  . Transportation needs - non-medical: Not on file    Occupational History  . Not on file  Tobacco Use  . Smoking status: Former Smoker    Last attempt to quit: 08/18/2011    Years since quitting: 5.5  . Smokeless tobacco: Never Used  Substance and Sexual Activity  . Alcohol use: Yes  . Drug use: No  . Sexual activity: Yes  Other Topics Concern  . Not on file  Social History Narrative  . Not on file     Constitutional: Denies fever, malaise, fatigue, headache or abrupt weight changes.  HEENT: Denies eye pain, eye redness, ear pain, ringing in the ears, wax buildup, runny nose, nasal congestion, bloody nose, or sore throat. Respiratory: Denies difficulty breathing, shortness of breath, cough or sputum production.   Cardiovascular: Denies chest pain, chest tightness, palpitations or swelling in the hands or feet.  Gastrointestinal: Denies abdominal pain, bloating, constipation, diarrhea or blood in the stool.  GU: Denies urgency, frequency, pain with urination, burning sensation, blood in urine, odor or discharge. Musculoskeletal: Denies decrease in range of motion, difficulty with gait, muscle pain or joint pain and swelling.  Skin: Denies redness, rashes, lesions or ulcercations.  Neurological: Denies dizziness, difficulty with memory, difficulty with speech or problems with balance and coordination.  Psych: Pt has a history of anxiety and depression. Denies SI/HI.  No other specific complaints in a complete review of systems (except as listed in HPI above).  Objective:   Physical Exam   BP 128/80   Ht 5' 2.5" (1.588 m)   Wt 133 lb (60.3 kg)   BMI 23.94 kg/m  Wt Readings from Last 3 Encounters:  03/09/17 133 lb (60.3 kg)  01/12/17 140 lb (63.5 kg)  07/03/16 137 lb (62.1 kg)    General: Appears her stated age, well developed, well nourished in NAD. Skin: Warm, dry and intact.  HEENT: Head: normal shape and size; Eyes: sclera white, no icterus, conjunctiva pink, PERRLA and EOMs intact;  Throat/Mouth: Teeth present, mucosa  pink and moist, no exudate, lesions or ulcerations noted.  Neck:  Neck supple, trachea midline. No masses, lumps  present.  Cardiovascular: Normal rate and rhythm. S1,S2 noted.  No murmur, rubs or gallops noted. No JVD or BLE edema. Carotid bruit noted on the left. Pulmonary/Chest: Normal effort and positive vesicular breath sounds. No respiratory distress. No wheezes, rales or ronchi noted.  Abdomen: Soft and nontender. Normal bowel sounds. No distention or masses noted. Liver, spleen and kidneys non palpable. Musculoskeletal: Strength 5/5 BUE/BLE. No difficulty with gait.  Neurological: Alert and oriented. Cranial nerves II-XII grossly intact. Coordination normal.  Psychiatric: Mood and affect normal. Behavior is normal. Judgment and thought content normal.     BMET    Component Value Date/Time   NA 141 02/28/2016 0814   K 4.0 02/28/2016 0814   CL 109 02/28/2016 0814   CO2 28 02/28/2016 0814   GLUCOSE 102 (H) 02/28/2016 0814   BUN 10 02/28/2016 0814   CREATININE 0.67 02/28/2016 0814   CALCIUM 9.5 02/28/2016 0814    Lipid Panel     Component Value Date/Time   CHOL 202 (H) 02/28/2016 0814   TRIG 210.0 (H) 02/28/2016 0814   HDL 37.70 (L) 02/28/2016 0814   CHOLHDL 5 02/28/2016 0814   VLDL 42.0 (H) 02/28/2016 0814   LDLCALC 136 (H) 07/25/2014 1145    CBC    Component Value Date/Time   WBC 9.5 06/19/2016 0757   RBC 4.51 06/19/2016 0757   HGB 13.1 06/19/2016 0757   HCT 40.3 06/19/2016 0757   PLT 321.0 06/19/2016 0757   MCV 89.4 06/19/2016 0757   MCHC 32.6 06/19/2016 0757   RDW 13.4 06/19/2016 0757   LYMPHSABS 3.4 06/19/2016 0757   MONOABS 0.8 06/19/2016 0757   EOSABS 0.2 06/19/2016 0757   BASOSABS 0.1 06/19/2016 0757    Hgb A1C No results found for: HGBA1C         Assessment & Plan:   Preventative Health Maintenance:  She gets her flu shot at work yearly Tetanus UTD No reason to have another pneumovax until age 57 She will call [facility] to set up  her mammogram Pap smear UTD Bone density due, but she declines She declines colon cancer screening Encouraged her to consume a balanced diet and exercise regimen Advised her to see an eye doctor and dentist annually Will check CBC, CMET, Lipid, Vit D, HIV and Hep C today  Left Carotid Bruit:  She does not want me to order ultrasound She will follow up with her cardiologist re: this.  RTC in 1 year, sooner if needed Webb Silversmith, NP

## 2017-03-10 LAB — HIV ANTIBODY (ROUTINE TESTING W REFLEX): HIV 1&2 Ab, 4th Generation: NONREACTIVE

## 2017-03-10 LAB — HEPATITIS C ANTIBODY
Hepatitis C Ab: NONREACTIVE
SIGNAL TO CUT-OFF: 0.04 (ref <1.00)

## 2017-03-11 ENCOUNTER — Other Ambulatory Visit: Payer: Self-pay | Admitting: Internal Medicine

## 2017-03-11 DIAGNOSIS — E559 Vitamin D deficiency, unspecified: Secondary | ICD-10-CM

## 2017-03-11 MED ORDER — VITAMIN D (ERGOCALCIFEROL) 1.25 MG (50000 UNIT) PO CAPS: 50000.0000 [IU] | ORAL_CAPSULE | ORAL | 0 refills | Status: DC

## 2017-03-11 NOTE — Progress Notes (Signed)
ergo 

## 2017-07-03 ENCOUNTER — Ambulatory Visit
Admission: EM | Admit: 2017-07-03 | Discharge: 2017-07-03 | Disposition: A | Discharge status: Home / Self Care | Payer: BC Managed Care – PPO | Attending: Family Medicine | Admitting: Family Medicine

## 2017-07-03 ENCOUNTER — Encounter: Payer: Self-pay | Admitting: Emergency Medicine

## 2017-07-03 ENCOUNTER — Other Ambulatory Visit: Payer: Self-pay

## 2017-07-03 DIAGNOSIS — R0981 Nasal congestion: Secondary | ICD-10-CM

## 2017-07-03 MED ORDER — AMOXICILLIN-POT CLAVULANATE 875-125 MG PO TABS: 1.0000 | ORAL_TABLET | Freq: Two times a day (BID) | ORAL | 0 refills | Status: DC

## 2017-07-03 NOTE — ED Triage Notes (Signed)
Patient c/o sinus problem x yesterday.

## 2017-07-03 NOTE — ED Provider Notes (Signed)
[facility]  CSN: [id number] Arrival date & time: 07/03/17  1839  History   Chief Complaint Chief Complaint  Patient presents with  . Sinusitis   HPI  62 year old female presents with concerns for sinusitis.  Patient states she has had ongoing allergies.  This is a chronic problem.  She states that she has had ongoing allergic symptoms which have now led to sinus pressure and congestion.  She reports discolored nasal discharge.  No reports of fevers or chills.  She continues her home allergy medication without relief.  She states that is been worse over the past 2 days.  No other associated symptoms.  No other concerns at this time.  Past Medical History:  Diagnosis Date  . Allergy   . Arthritis   . GERD (gastroesophageal reflux disease)   . History of migraine headaches   . Hyperlipidemia   . Hyperthyroidism   . Tobacco abuse    Patient Active Problem List   Diagnosis Date Noted  . GERD (gastroesophageal reflux disease) 01/12/2017  . HLD (hyperlipidemia) 01/12/2017  . Hyperthyroidism 02/28/2016  . Adjustment disorder with mixed anxiety and depressed mood 02/28/2016  . Migraine 07/25/2014  . Allergic rhinitis 07/25/2014  . Arthritis 07/25/2014   Past Surgical History:  Procedure Laterality Date  . KNEE SURGERY    . TONSILECTOMY, ADENOIDECTOMY, BILATERAL MYRINGOTOMY AND TUBES    . TUBAL LIGATION     OB History   None    Home Medications    Prior to Admission medications   Medication Sig Start Date End Date Taking? Authorizing Provider  meloxicam (MOBIC) 15 MG tablet Take by mouth. 06/13/15  Yes [provider]  methimazole (TAPAZOLE) 5 MG tablet 1/2 tab by mouth every other day Patient taking differently: 10 mg. 1/2 tab by mouth every other day 09/25/15  Yes Lucille Passy, MD  ondansetron (ZOFRAN) 4 MG tablet TAKE ONE TABLET BY MOUTH EVERY EIGHT HOURS AS NEEDED FOR NAUSEA OR VOMITING 02/02/17  Yes Jearld Fenton, NP  amoxicillin-clavulanate  (AUGMENTIN) 875-125 MG tablet Take 1 tablet by mouth every 12 (twelve) hours. 07/03/17   Coral Spikes, DO  loteprednol (LOTEMAX) 0.2 % SUSP Apply 1 drop to eye 4 (four) times daily. 04/23/16   [provider]  mometasone (NASONEX) 50 MCG/ACT nasal spray Place 2 sprays into the nose daily as needed.    [provider]    Family History Family History  Adopted: Yes    Social History Social History   Tobacco Use  . Smoking status: Former Smoker    Last attempt to quit: 08/18/2011    Years since quitting: 5.8  . Smokeless tobacco: Never Used  Substance Use Topics  . Alcohol use: Yes  . Drug use: No   Allergies   Atenolol and Codeine  Review of Systems Review of Systems  Constitutional: Negative.   HENT: Positive for congestion, sinus pressure and sinus pain.    Physical Exam Triage Vital Signs ED Triage Vitals  Enc Vitals Group     BP 07/03/17 1850 (!) 155/83     Pulse Rate 07/03/17 1850 78     Resp 07/03/17 1850 16     Temp 07/03/17 1850 98.3 F (36.8 C)     Temp Source 07/03/17 1850 Oral     SpO2 07/03/17 1850 100 %     Weight --      Height --      Head Circumference --      Peak  Flow --      Pain Score 07/03/17 1904 0     Pain Loc --      Pain Edu? --      Excl. in [place]? --    Updated Vital Signs BP (!) 155/83 (BP Location: Left Arm)   Pulse 78   Temp 98.3 F (36.8 C) (Oral)   Resp 16   SpO2 100%   Physical Exam  Constitutional: She is oriented to person, place, and time. She appears well-developed. No distress.  HENT:  Head: Normocephalic and atraumatic.  Maxillary sinus tenderness to palpation.  Cardiovascular: Normal rate and regular rhythm.  Pulmonary/Chest: Effort normal and breath sounds normal. She has no wheezes. She has no rales.  Neurological: She is alert and oriented to person, place, and time.  Psychiatric: She has a normal mood and affect. Her behavior is normal.  Nursing note and vitals reviewed.  UC Treatments / Results    Labs (all labs ordered are listed, but only abnormal results are displayed) Labs Reviewed - No data to display  EKG None  Radiology No results found.  Procedures Procedures (including critical care time)  Medications Ordered in UC Medications - No data to display  Initial Impression / Assessment and Plan / UC Course  I have reviewed the triage vital signs and the nursing notes.  Pertinent labs & imaging results that were available during my care of the patient were reviewed by me and considered in my medical decision making (see chart for details).    62 year old female presents with concerns for sinusitis.  Patient having sinus pressure and congestion.  This is likely secondary to allergies at this point.  I advised her to continue her home allergy medication of Nasonex.  Continue to monitor closely.  If she fails to improve or worsens, she may fill the antibiotic.  Final Clinical Impressions(s) / UC Diagnoses   Final diagnoses:  Sinus congestion     Discharge Instructions     Continue your allergy meds.  Give it some time.  If you worsen or fail to improve, start the antibiotic.  Take care  Dr. Lacinda Axon    ED Prescriptions    Medication Sig Dispense Auth. Provider   amoxicillin-clavulanate (AUGMENTIN) 875-125 MG tablet Take 1 tablet by mouth every 12 (twelve) hours. 14 tablet Coral Spikes, DO     Controlled Substance Prescriptions [place] Controlled Substance Registry consulted? Not Applicable  Coral Spikes, DO 07/03/17 1928

## 2017-07-03 NOTE — Discharge Instructions (Signed)
Continue your allergy meds.  Give it some time.  If you worsen or fail to improve, start the antibiotic.  Take care  Dr. Lacinda Axon

## 2017-09-07 DIAGNOSIS — C4491 Basal cell carcinoma of skin, unspecified: Secondary | ICD-10-CM | POA: Insufficient documentation

## 2017-09-23 ENCOUNTER — Other Ambulatory Visit: Payer: Self-pay | Admitting: Internal Medicine

## 2017-11-11 ENCOUNTER — Ambulatory Visit: Payer: BC Managed Care – PPO | Admitting: Podiatry

## 2017-11-11 ENCOUNTER — Encounter: Payer: Self-pay | Admitting: Podiatry

## 2017-11-11 ENCOUNTER — Ambulatory Visit: Payer: BC Managed Care – PPO

## 2017-11-11 ENCOUNTER — Ambulatory Visit (INDEPENDENT_AMBULATORY_CARE_PROVIDER_SITE_OTHER): Payer: BC Managed Care – PPO

## 2017-11-11 DIAGNOSIS — M722 Plantar fascial fibromatosis: Secondary | ICD-10-CM

## 2017-11-11 NOTE — Progress Notes (Signed)
She presents today after having not seen her since 2017 states that is been hurting again about a month or so now.  Objective: Vital signs are stable alert and oriented x3.  Pulses are palpable.  She has pain on palpation medial calcaneal tubercle of the right heel.  Assessment: Plantar fasciitis right.  Plan: We will get her into a new set of orthotics and I went ahead and injected her right heel today after his Betadine skin prep 20 mg Kenalog 5 mg Marcaine.

## 2017-11-11 NOTE — Patient Instructions (Signed)

## 2017-11-20 ENCOUNTER — Ambulatory Visit: Payer: BC Managed Care – PPO | Admitting: Family Medicine

## 2017-11-20 ENCOUNTER — Encounter: Payer: Self-pay | Admitting: Family Medicine

## 2017-11-20 VITALS — BP 152/74 | HR 84 | Temp 98.3°F | Ht 62.5 in | Wt 136.0 lb

## 2017-11-20 DIAGNOSIS — J069 Acute upper respiratory infection, unspecified: Secondary | ICD-10-CM

## 2017-11-20 DIAGNOSIS — W19XXXA Unspecified fall, initial encounter: Secondary | ICD-10-CM | POA: Diagnosis not present

## 2017-11-20 MED ORDER — ONDANSETRON 8 MG PO TBDP: 8.0000 mg | ORAL_TABLET | Freq: Three times a day (TID) | ORAL | 0 refills | Status: DC | PRN

## 2017-11-20 NOTE — Progress Notes (Signed)
Subjective:    Patient ID: Lydia Carter, female    DOB: 09-Sep-1955, 62 y.o.   MRN: [id number]  HPI This is a 62 yo female who presents today with cough and muscle aches x 5 days. Low grade temp, green nasal drainage, non productive cough, no sore throat or ear pain. No wheeze or SOB. Has been working all week. Has been taking tylenol and advil. Has been exposed to flu and stomach bug. Took a Zofran for stomach upset. No diarrhea or vomiting. Works at [employer] in [place], sports. Had a fall onto her left side earlier this week while at work. Slipped on some magnolia leaves on a side walk and fell. Some pain of left lower rib cage and bruising of left arm and leg. No pain with deep breath, no shoulder or knee pain.    Past Medical History:  Diagnosis Date  . Allergy   . Arthritis   . GERD (gastroesophageal reflux disease)   . History of migraine headaches   . Hyperlipidemia   . Hyperthyroidism   . Tobacco abuse    Past Surgical History:  Procedure Laterality Date  . KNEE SURGERY    . TONSILECTOMY, ADENOIDECTOMY, BILATERAL MYRINGOTOMY AND TUBES    . TUBAL LIGATION     Family History  Adopted: Yes   Social History   Tobacco Use  . Smoking status: Former Smoker    Last attempt to quit: 08/18/2011    Years since quitting: 6.2  . Smokeless tobacco: Never Used  Substance Use Topics  . Alcohol use: Yes  . Drug use: No      Review of Systems Per HPI    Objective:   Physical Exam  Constitutional: She appears well-developed and well-nourished. No distress.  HENT:  Head: Normocephalic and atraumatic.  Right Ear: Tympanic membrane, external ear and ear canal normal.  Left Ear: Tympanic membrane, external ear and ear canal normal.  Nose: No mucosal edema or rhinorrhea.  Mouth/Throat: Uvula is midline and mucous membranes are normal. Posterior oropharyngeal erythema present. No oropharyngeal exudate, posterior oropharyngeal edema or tonsillar abscesses.  Small  nares.   Cardiovascular: Normal rate, regular rhythm and normal heart sounds.  Pulmonary/Chest: Effort normal and breath sounds normal. No respiratory distress. She exhibits no tenderness.  Neurological: She is alert.  Skin: Skin is warm and dry. She is not diaphoretic.  Psychiatric: She has a normal mood and affect. Her behavior is normal. Judgment and thought content normal.  Vitals reviewed.    BP (!) 152/74 (BP Location: Right Arm, Patient Position: Sitting, Cuff Size: Normal)   Pulse 84   Temp 98.3 F (36.8 C) (Oral)   Ht 5' 2.5" (1.588 m)   Wt 136 lb (61.7 kg)   SpO2 97%   BMI 24.48 kg/m  Wt Readings from Last 3 Encounters:  11/20/17 136 lb (61.7 kg)  03/09/17 133 lb (60.3 kg)  01/12/17 140 lb (63.5 kg)   BP Readings from Last 3 Encounters:  11/20/17 (!) 152/74  07/03/17 (!) 155/83  03/09/17 128/80        Assessment & Plan:  1. Viral URI -Provided written and verbal information regarding diagnosis and treatment. -  Patient Instructions  Good to see you today  I think you have a viral illness. You need to rest and get in good fluid intake.   For nasal congestion you can use Afrin nasal spray for 3 days max, saline nasal spray (generic is fine for all). For cough  you can try Delsym. Drink enough fluids to make your urine light yellow. For fever/chill/muscle aches you can take over the counter acetaminophen or ibuprofen.  Please come back in if you are not better in 5-7 days or if you develop wheezing, shortness of breath or persistent vomiting.    - OOW today  2. Fall, initial encounter - no worrisome findings on history or physical - OTC analgesics prn   Clarene Reamer, FNP-BC  [facility], [place]  11/20/2017 10:33 AM

## 2017-11-20 NOTE — Patient Instructions (Addendum)
Good to see you today  I think you have a viral illness. You need to rest and get in good fluid intake.   For nasal congestion you can use Afrin nasal spray for 3 days max, saline nasal spray (generic is fine for all). For cough you can try Delsym. Drink enough fluids to make your urine light yellow. For fever/chill/muscle aches you can take over the counter acetaminophen or ibuprofen.  Please come back in if you are not better in 5-7 days or if you develop wheezing, shortness of breath or persistent vomiting.

## 2017-12-02 ENCOUNTER — Other Ambulatory Visit: Payer: BC Managed Care – PPO | Admitting: Orthotics

## 2017-12-18 DIAGNOSIS — E559 Vitamin D deficiency, unspecified: Secondary | ICD-10-CM | POA: Insufficient documentation

## 2018-02-01 ENCOUNTER — Encounter: Payer: Self-pay | Admitting: Podiatry

## 2018-02-01 ENCOUNTER — Ambulatory Visit: Payer: BC Managed Care – PPO | Admitting: Podiatry

## 2018-02-01 DIAGNOSIS — M722 Plantar fascial fibromatosis: Secondary | ICD-10-CM | POA: Diagnosis not present

## 2018-02-01 NOTE — Progress Notes (Signed)
This patient presents to the office stating she has severe pain in her right heel  She was seen by Dr.  Milinda Pointer who provided her with injection therapy and he prescribed a new set of orthoses.  She has received her orthoses but says she is placing weight on the outside of her foot while she walks.  She presents for evaluation and treatment.  Vital signs are present  And normal.  Pulses are palpable.  She has pain upon palpation tuberosity of right heel  Plantar fasciitis right foot  ROV.  Injection therapy was provided of 1.0 cc. Marcaine and 20 mg of kenalog 40.    Told to wear her orthoses.  RTC prn   Gardiner Barefoot DPM

## 2018-04-15 ENCOUNTER — Encounter: Payer: Self-pay | Admitting: Family Medicine

## 2018-04-15 ENCOUNTER — Telehealth: Payer: Self-pay

## 2018-04-15 ENCOUNTER — Ambulatory Visit: Payer: BC Managed Care – PPO | Admitting: Family Medicine

## 2018-04-15 VITALS — BP 154/76 | HR 85 | Temp 98.2°F | Ht 62.5 in | Wt 142.5 lb

## 2018-04-15 DIAGNOSIS — N3000 Acute cystitis without hematuria: Secondary | ICD-10-CM

## 2018-04-15 DIAGNOSIS — R300 Dysuria: Secondary | ICD-10-CM | POA: Diagnosis not present

## 2018-04-15 DIAGNOSIS — R030 Elevated blood-pressure reading, without diagnosis of hypertension: Secondary | ICD-10-CM | POA: Diagnosis not present

## 2018-04-15 LAB — POCT URINALYSIS DIPSTICK
GLUCOSE UA: NEGATIVE
Ketones, UA: POSITIVE
Nitrite, UA: NEGATIVE
Protein, UA: POSITIVE — AB
RBC UA: NEGATIVE
SPEC GRAV UA: 1.025 (ref 1.010–1.025)
UROBILINOGEN UA: 0.2 U/dL
pH, UA: 5.5 (ref 5.0–8.0)

## 2018-04-15 MED ORDER — SULFAMETHOXAZOLE-TRIMETHOPRIM 800-160 MG PO TABS: 1.0000 | ORAL_TABLET | Freq: Two times a day (BID) | ORAL | 0 refills | Status: AC

## 2018-04-15 NOTE — Patient Instructions (Signed)
Likely a urinary tract infection  Take the antibiotic  Will send urine for culture to know for sure.   I'm glad you are seeing your OB/GYN tomorrow to be evaluated given the pelvic pressure  If you develop fevers, chills, nausea, vomiting, worsening symptoms or worsening back pain return to clinic or consider going to the ER

## 2018-04-15 NOTE — Telephone Encounter (Signed)
[facility] Night - Client Nonclinical Telephone Record [facility] Night - Client Client Site [place] - Night Contact Type Call Who Is Calling Patient / Member / Family / Caregiver Caller Name [place] Phone Number [phone] Patient Name Lydia Carter Patient DOB [birth year] Call Type Message Only Information Provided Reason for Call Request to Schedule Office Appointment Initial Comment Caller states that she is having some pelvic issues and is wanting to be seen. She does not want to talk with a nurse. Additional Comment Provided office hours to the caller as well. Call Closed By: Toma Deiters Transaction Date/Time: 04/15/2018 7:31:37 AM (ET)

## 2018-04-15 NOTE — Progress Notes (Signed)
Subjective:     Lydia Carter is a 63 y.o. female presenting for Dysuria (started suddenly last night-04/14/2018. Had discomfort with urination, pelvic pressure and feels swollen. "Feels like something is coming out." Back pain present. Temp today 99.4.)     Dysuria   This is a new problem. The current episode started yesterday. The problem occurs intermittently. The problem has been gradually worsening. The quality of the pain is described as burning. There has been no fever. She is sexually active. There is no history of pyelonephritis. Associated symptoms include flank pain, frequency and urgency. Pertinent negatives include no chills, discharge, hematuria, hesitancy, nausea, sweats or vomiting. She has tried acetaminophen and increased fluids for the symptoms. The treatment provided mild relief. There is no history of recurrent UTIs.   Feels like something is coming out    Review of Systems  Constitutional: Negative for chills.  Gastrointestinal: Negative for nausea and vomiting.  Genitourinary: Positive for dysuria, flank pain, frequency, pelvic pain and urgency. Negative for hematuria and hesitancy.     Social History   Tobacco Use  Smoking Status Former Smoker  . Last attempt to quit: 08/18/2011  . Years since quitting: 6.6  Smokeless Tobacco Never Used        Objective:    BP Readings from Last 3 Encounters:  04/15/18 (!) 154/76  11/20/17 (!) 152/74  07/03/17 (!) 155/83   Wt Readings from Last 3 Encounters:  04/15/18 142 lb 8 oz (64.6 kg)  11/20/17 136 lb (61.7 kg)  03/09/17 133 lb (60.3 kg)    BP (!) 154/76   Pulse 85   Temp 98.2 F (36.8 C)   Ht 5' 2.5" (1.588 m)   Wt 142 lb 8 oz (64.6 kg)   SpO2 99%   BMI 25.65 kg/m    Physical Exam Constitutional:      General: She is not in acute distress.    Appearance: She is well-developed. She is not diaphoretic.  HENT:     Head: Normocephalic and atraumatic.  Eyes:     Conjunctiva/sclera: Conjunctivae  normal.  Neck:     Musculoskeletal: Neck supple.  Cardiovascular:     Rate and Rhythm: Normal rate and regular rhythm.     Heart sounds: Normal heart sounds.  Pulmonary:     Effort: Pulmonary effort is normal.  Abdominal:     General: Bowel sounds are normal. There is no distension.     Palpations: Abdomen is soft.     Tenderness: There is no abdominal tenderness. There is right CVA tenderness. There is no left CVA tenderness or guarding.  Skin:    General: Skin is warm and dry.  Neurological:     Mental Status: She is alert.      UA: +protein and +LE, neg nitrites     Assessment & Plan:   Problem List Items Addressed This Visit      Other   Elevated blood pressure reading    Advised home monitoring and returning for follow-up appointment to address. Also with hyperthyroidism which could be contributing.        Other Visit Diagnoses    Dysuria    -  Primary   Relevant Medications   sulfamethoxazole-trimethoprim (BACTRIM DS,SEPTRA DS) 800-160 MG tablet   Other Relevant Orders   POCT urinalysis dipstick (Completed)   Urine Culture   Acute cystitis without hematuria       Relevant Medications   sulfamethoxazole-trimethoprim (BACTRIM DS,SEPTRA DS) 800-160 MG tablet  Based on symptoms suspect UTI. Will follow-up urine culture to confirm and look for sensitivities.    Pelvic pressure may be related to UTI but may not, pt has OB/GYN appointment so deferred pelvic exam today as will be seeing her gyn tomorrow.   Return if symptoms worsen or fail to improve.  Lesleigh Noe, MD

## 2018-04-15 NOTE — Assessment & Plan Note (Signed)
Advised home monitoring and returning for follow-up appointment to address. Also with hyperthyroidism which could be contributing.

## 2018-04-15 NOTE — Telephone Encounter (Signed)
Pt has already seen Dr Einar Pheasant today. The team health information would not come up this morning in the computer. FYI to Dr Einar Pheasant.

## 2018-04-16 ENCOUNTER — Encounter: Payer: Self-pay | Admitting: Obstetrics and Gynecology

## 2018-04-16 ENCOUNTER — Ambulatory Visit: Payer: BC Managed Care – PPO | Admitting: Obstetrics and Gynecology

## 2018-04-16 VITALS — BP 120/72 | HR 78 | Ht 62.0 in | Wt 141.2 lb

## 2018-04-16 DIAGNOSIS — N810 Urethrocele: Secondary | ICD-10-CM

## 2018-04-16 DIAGNOSIS — N8111 Cystocele, midline: Secondary | ICD-10-CM

## 2018-04-16 NOTE — Patient Instructions (Addendum)
About Cystocele  Overview  The pelvic organs, including the bladder, are normally supported by pelvic floor muscles and ligaments.  When these muscles and ligaments are stretched, weakened or torn, the wall between the bladder and the vagina sags or herniates causing a prolapse, sometimes called a cystocele.  This condition may cause discomfort and problems with emptying the bladder.  It can be present in various stages.  Some people are not aware of the changes.  Others may notice changes at the vaginal opening or a feeling of the bladder dropping outside the body.  Causes of a Cystocele  A cystocele is usually caused by muscle straining or stretching during childbirth.  In addition, cystocele is more common after menopause, because the hormone estrogen helps keep the elastic tissues around the pelvic organs strong.  A cystocele is more likely to occur when levels of estrogen decrease.  Other causes include: heavy lifting, chronic coughing, previous pelvic surgery and obesity.  Symptoms  A bladder that has dropped from its normal position may cause: unwanted urine leakage (stress incontinence), frequent urination or urge to urinate, incomplete emptying of the bladder (not feeling bladder relief after emptying), pain or discomfort in the vagina, pelvis, groin, lower back or lower abdomen and frequent urinary tract infections.  Mild cases may not cause any symptoms.  Treatment Options  Pelvic floor (Kegel) exercises:  Strength training the muscles in your genital area  Behavioral changes: Treating and preventing constipation, taking time to empty your bladder properly, learning to lift properly and/or avoid heavy lifting when possible, stopping smoking, avoiding weight gain and treating a chronic cough or bronchitis.  A pessary: A vaginal support device is sometimes used to help pelvic support caused by muscle and ligament changes.  Surgery: Surgical repair may be necessary if symptoms cannot  be managed with exercise, behavioral changes and a pessary.  Surgery is usually considered for severe cases.   2007, [employer]rapeuticsKegel Exercises Kegel exercises help strengthen the muscles that support the rectum, vagina, small intestine, bladder, and uterus. Doing Kegel exercises can help:  Improve bladder and bowel control.  Improve sexual response.  Reduce problems and discomfort during pregnancy. Kegel exercises involve squeezing your pelvic floor muscles, which are the same muscles you squeeze when you try to stop the flow of urine. The exercises can be done while sitting, standing, or lying down, but it is best to vary your position. Exercises 1. Squeeze your pelvic floor muscles tight. You should feel a tight lift in your rectal area. If you are a female, you should also feel a tightness in your vaginal area. Keep your stomach, buttocks, and legs relaxed. 2. Hold the muscles tight for up to 10 seconds. 3. Relax your muscles. Repeat this exercise 50 times a day or as many times as told by your health care provider. Continue to do this exercise for at least 4-6 weeks or for as long as told by your health care provider. This information is not intended to replace advice given to you by your health care provider. Make sure you discuss any questions you have with your health care provider. Document Released: 01/21/2012 Document Revised: 06/16/2016 Document Reviewed: 12/24/2014 Elsevier Interactive Patient Education  2019 [employer].

## 2018-04-16 NOTE — Progress Notes (Signed)
  Subjective:     Patient ID: MAGDALENE TARDIFF, female   DOB: 12/03/1955, 63 y.o.   MRN: [id number]  HPI Here with concerns about feeling like something is falling out of vaginal, has been noted for 2 days. While in shower after working, felt pressure and like she was sitting on a ball. Never remembers feeling this before.  She saw PCP yesterday and was start on medication for UTI. Last UTI >25 years ago.  Reports 1 child with vaginal delivery. Went through menopause in late 18s.  Had tubes tied. Only abdominal surgery.   Review of Systems  Genitourinary: Positive for vaginal pain.  All other systems reviewed and are negative.      Objective:   Physical Exam A&Ox4 Well groomed female in no distress Blood pressure 120/72, pulse 78, height 5\' 2"  (1.575 m), weight 141 lb 3.2 oz (64 kg). Body mass index is 25.83 kg/m.  Pelvic exam: VULVA: normal appearing vulva with no masses, tenderness or lesions, VAGINA: normal appearing vagina with normal color and discharge, no lesions, atrophic, urethracele and mild cystocele (midline) noted, poor muscle tone with attempted kegel. CERVIX: normal appearing cervix without discharge or lesions, UTERUS: uterus is normal size, shape, consistency and nontender, mobile, ADNEXA: normal adnexa in size, nontender and no masses.    Assessment:     urethracele cystocele    Plan:    counseled on findings and treatment options. Desires less invasive options as possible.  Encouraged kegel exercises Referred for pelvic floor PT at [facility].  RTC as needed.   Nakeia Calvi,CNM

## 2018-04-17 LAB — URINE CULTURE
MICRO NUMBER: 251164
SPECIMEN QUALITY: ADEQUATE

## 2018-05-18 ENCOUNTER — Encounter: Payer: BC Managed Care – PPO | Admitting: Family Medicine

## 2018-08-16 ENCOUNTER — Encounter: Payer: BC Managed Care – PPO | Admitting: Family Medicine

## 2018-08-17 ENCOUNTER — Encounter: Payer: Self-pay | Admitting: Physical Therapy

## 2018-08-17 ENCOUNTER — Other Ambulatory Visit: Payer: Self-pay

## 2018-08-17 ENCOUNTER — Ambulatory Visit: Payer: BC Managed Care – PPO | Attending: Obstetrics and Gynecology | Admitting: Physical Therapy

## 2018-08-17 DIAGNOSIS — M79671 Pain in right foot: Secondary | ICD-10-CM | POA: Diagnosis present

## 2018-08-17 DIAGNOSIS — R29898 Other symptoms and signs involving the musculoskeletal system: Secondary | ICD-10-CM | POA: Diagnosis present

## 2018-08-17 DIAGNOSIS — M6281 Muscle weakness (generalized): Secondary | ICD-10-CM | POA: Insufficient documentation

## 2018-08-17 NOTE — Therapy (Addendum)
[place] MAIN [facility] SERVICES [address] [place], [place], [address] Phone: [phone]   Fax:  [phone]  Physical Therapy Evaluation  Patient Details  Name: Lydia Carter MRN: [id number] Date of Birth: Oct 07, 1955 Referring Provider (PT): Gayla Medicus    Encounter Date: 08/17/2018  PT End of Session - 08/17/18 1847    Visit Number  1    Number of Visits  10    Date for PT Re-Evaluation  10/26/18    PT Start Time  4401    PT Stop Time  1240    PT Time Calculation (min)  55 min    Activity Tolerance  Patient tolerated treatment well;No increased pain    Behavior During Therapy  WFL for tasks assessed/performed       Past Medical History:  Diagnosis Date  . Allergy   . Arthritis   . GERD (gastroesophageal reflux disease)   . History of migraine headaches   . Hyperlipidemia   . Hyperthyroidism   . Tobacco abuse     Past Surgical History:  Procedure Laterality Date  . KNEE SURGERY Left    laproscopic  . TONSILECTOMY, ADENOIDECTOMY, BILATERAL MYRINGOTOMY AND TUBES    . TUBAL LIGATION      There were no vitals filed for this visit.   Subjective Assessment - 08/17/18 1151    Subjective 1) Pt reports she had prolapse Sx in Feb 2020. All of a sudden, she felt she was sitting on a ball and noticed her underrwear would stick to it. Pt sits in a chair all day. Pt notices it when she picks something up at the grocery or great-grandson, bending over.  No issues with urination to initiate/ complete, no frequency. When she needs to go urinate, she needs go right away.   Sometimes straining with bowel movements but they occur daily.  Daily fluid intake: 20 fl oz water, 24 fl oz of Coke soda, no coffee, no juice. Pain with sexual intercourse because she was dry.  Pt has not used vaginal moisturizers before. Physical activity: pre Covid: walking 2-42miles. Currently no exercise. Used to do sit-ups with sand bag over chest while sitting on a ball for  2-3 years until she hurt her R foot.    2)  recurrent UTI-like Sx. In Feb, pt felt like pressure and not completing urination. UTI test was neg. Pt feels these Sx of pressure and incomplete emptying with LBP every 7 days.        3) R foot pain: plantar fasciits and Hx of metatarsal Fx of R both started when she had her L knee surgery  ( laproscopic) 8-10 years ago.  Sitting 3/10 with nerve pain on top of the foot, feels like throbbing. When walking, or wearing compression socks, the pain 0-1/10.  Used to wear high heels.  Feels unsteady on her feet. Pt had a bad fall ( fall 2019/ winter 2020) where pt is not sure what happened. Pt hurt so bad on her L side which is where she fell. No Fx. No pain now from this fall. Pt reports she used to fall alot and thinks it was due to her glasses but she is trying to be safer now.    Pertinent History  1 vaginal delivery with episiotomy.    Patient Stated Goals  keep from having surgery for prolapse, be healthier         [facility] PT Assessment - 08/17/18 1859      Assessment  Medical Diagnosis  cystocele     Referring Provider (PT)  [place]       Precautions   Precautions  None      Restrictions   Weight Bearing Restrictions  No      Balance Screen   Has the patient fallen in the past 6 months  No      Observation/Other Assessments   Observations  ankles cross, slumped sitting       Coordination   Gross Motor Movements are Fluid and Coordinated  --   chest breathing   Fine Motor Movements are Fluid and Coordinated  --   exhalation with ab overuse, able to elicit pelvic floor lift     Strength   Overall Strength Comments  L hip flex,knee flex 3+/5, knee ext 4+/5, R hip flex 3+/5/L  knee flex/ext 4-/5 .  hip ext 4+/5B, hip abd R 3+/5, L 4+/5 .  DF/EV OKC: 3+/5 B, PF/IN  4-/5 B. L standing heel raises: 2/5 , 3-/5        Palpation   Spinal mobility  throacic region hypomobile     SI assessment   PSIS levelled, sacrum hypomobile in nutation     Palpation comment  feet: lack abduction of rays, limited mobility at midfoot joints for DF        Ambulation/Gait   Gait Comments  with shoes on includes orthotics in shoes: scissoring gait, supination. No pain, Barefoot: with pain. no scissoring gait, no supination                   Objective measurements completed on examination: See above findings.      [facility] PT Treatment/Exercise - 08/17/18 1859      Neuro Re-ed    Neuro Re-ed Details   cued for logrolling, deep core coordination with quick pelvic floor contraction                  PT Long Term Goals - 08/17/18 1218      PT LONG TERM GOAL #1   Title  Pt will report decreased UTI-like symptoms from once a week to every 2 weeks in order to restore QOL    Time  5    Period  Weeks    Status  New    Target Date  09/21/18      PT LONG TERM GOAL #2   Title  Pt will be able to report decreased R foot pain by 50% and no nerve pain when sitting at work in order perform work duties.    Time  8    Period  Weeks    Status  New    Target Date  10/12/18      PT LONG TERM GOAL #3   Title  Pt will demo proper body mechanics with bending and lifting and apply co-activation techniques with deep core to minimzie straining pelvic floor and organs.    Time  4    Period  Weeks    Status  New    Target Date  09/14/18      PT LONG TERM GOAL #4   Title  Pt will decrease her PFIQ from % to < % in order to restore pelvic floor mm    Time  10    Period  Weeks    Status  New    Target Date  10/26/18      PT LONG TERM GOAL #5   Title  Pt will  demo increased strength BLE 5/5 in order to improve gait    Time  10    Period  Weeks    Status  New    Target Date  10/26/18      Additional Long Term Goals   Additional Long Term Goals  Yes      PT LONG TERM GOAL #6   Title  Pt will demo proper pelvic floor contractions with deep core coordination to minimize worsening of prolapse    Time  4    Period  Weeks     Status  New    Target Date  09/14/18      PT LONG TERM GOAL #7   Title  Pt will demo proper body mechanics with coactivation of deep core and lower kinetic chain to mininize straining pelvic floor and pelvic organs    Time  6    Period  Weeks    Status  New    Target Date  09/28/18      PT LONG TERM GOAL #8   Title  Pt will increase her LEFS score from pts to > pts in order to improve R foot pain and plantar fasciitis    Time  10    Period  Weeks    Status  New    Target Date  10/26/18             Plan - 08/17/18 1847    Clinical Impression Statement  Pt is a 63 yo female who reports prolapse Sx, recurrent UTI-like Sx, and R foot pain/ plantar fasciitis. These deficits impact her functional mobility with loaded tasks ( lifting, bending), and gait. Pt's clinical presentations include gait deviations ( scissoring gait with orthotics in shoes), BLE weakness, dyscoordination of deep core mm, limitations in sacral mobility, slumped posture, poor body mechanics that place strain onto pelvic floor, deviations in feet in gait with limited mobility and transverse arch co-activation. Following Tx today, pt demo'd proper deep core coordination and progressed to quick pelvic floor contraction and technique for hip strengthening in sidelying position.   Given Hx of  L laproscopic knee surgery and R metatarsal Fx, pt will benefit from a regional interdependent approach to optimize pelvic girdle stability with optimal lower kinetic chain chain mobility and co-activation of both systems in upright and against gravity tasks in order to achieve goals.            Personal Factors and Comorbidities  Age;Comorbidity 3+;Other  GERD, Arhtritis, hyperlipidemia,    Comorbidities    Examination-Activity Limitations  Continence;Toileting;Sit;Stand;Locomotion Level    Stability/Clinical Decision Making  Evolving/Moderate complexity    Clinical Decision Making  Moderate    Rehab Potential  Good    PT  Frequency  1x / week    PT Duration  Other (comment)   10   PT Treatment/Interventions  Moist Heat;Functional mobility training;Therapeutic activities;Therapeutic exercise;Manual techniques;Neuromuscular re-education;Taping;Scar mobilization;Gait training;Balance training;Patient/family education    Consulted and Agree with Plan of Care  Patient       Patient will benefit from skilled therapeutic intervention in order to improve the following deficits and impairments:  Decreased mobility, Decreased strength, Postural dysfunction, Improper body mechanics, Pain, Increased muscle spasms, Decreased endurance, Decreased range of motion, Decreased coordination, Difficulty walking, Decreased balance, Hypomobility  Visit Diagnosis: 1. Other symptoms and signs involving the musculoskeletal system   2. Pain in right foot   3. Muscle weakness (generalized)        Problem List Patient Active  Problem List   Diagnosis Date Noted  . Elevated blood pressure reading 04/15/2018  . BCC (basal cell carcinoma) 09/07/2017  . GERD (gastroesophageal reflux disease) 01/12/2017  . HLD (hyperlipidemia) 01/12/2017  . Hyperthyroidism 02/28/2016  . Adjustment disorder with mixed anxiety and depressed mood 02/28/2016  . Migraine 07/25/2014  . Allergic rhinitis 07/25/2014  . Arthritis 07/25/2014    Jerl Mina ,PT, DPT, E-RYT  08/17/2018, 7:00 PM  [place] MAIN [facility] SERVICES [address] [place], [place], [address] Phone: [phone]   Fax:  [phone]  Name: Lydia Carter MRN: [id number] Date of Birth: [birth year]

## 2018-08-17 NOTE — Patient Instructions (Addendum)
Moisturizers .             They are used in the vagina to hydrate the mucous membrane that make up the vaginal canal. .             Designed to keep a more normal acid balance (ph) .             Once placed in the vagina, it will last between two to three days. .             Use 2-3 times per week at bedtime and last longer than 60 min. .             Ingredients to avoid is glycerin and fragrance, can increase chance of infection .             Should not be used just before sex due to causing irritation .             Most are gels administered either in a tampon-shaped applicator or as a vaginal suppository.                They  are non-hormonal.     Types of Moisturizers .             Samul Dada- drug store .             Vitamin E vaginal suppositories- Whole foods, Amazon .             Moist Again .             Coconut oil- can break down condoms .             Julva- (Do no use if on Tamoxifen) amazon .             Yes moisturizer- amazon .             NeuEve Silk , NeuEve Silver for menopausal or over 65 (if have severe vaginal atrophy or cancer                treatments use NeuEve Silk for  1 month than move to [employer] Pepsi)- [employer], [email or web address] .             Olive and Bee intimate cream- [email or web address] .             Mae vaginal moisturizer- Amazon      Lubrication .             Used for intercourse to reduce friction .             Avoid ones that have glycerin, warming gels, tingling gels, icing or cooling gel, scented .             Avoid parabens due to a preservative similar to female sex hormone .             May need to be reapplied once or several times during sexual activity .             Can be applied to both partners genitals prior to vaginal penetration to minimize friction or irritation .             Prevent irritation and mucosal tears that cause post coital pain and increased the risk of vaginal and                urinary tract infections .  Oil-based  lubricants cannot be used with condoms due to breaking them down.  Least likely to irritate                vaginal tissue. .             Plant based-lubes are safe .             Silicone-based lubrication are thicker and last long and used for post-menopausal women   Vaginal Lubricators Here is a list of some suggested lubricators you can use for intercourse. Use the most hypoallergenic product.  You can place on you or your partner. Glori Bickers Stuff     Sylk or Sliquid Natural H2O ( good  if frequent UTI's)                Blossom Organics ([email or web address]-[email or web address])                Luvena                Coconut oil                PJur Woman Nude- water based lubricant, amazon                Uberlube- Amazon                Aloe Vera                Yes lubricant- [employer] Platinum-Silicone, Target, [employer]                Olive and Bee intimate cream-  [email or web address].[email or web address]  Things to avoid in lubricants are glycerin, warming gels, tingling gels, icing or cooling gels, and scented gels.  Also avoid Vaseline. KY jelly, Replens, and Astroglide kills good bacteria(lactobacilli)   Things to avoid in the vaginal area .             Do not use things to irritate the vulvar area .             No lotions- see below .             No soaps; can use Aveeno or Calendula cleanser if needed. Must be gentle .             No deodorants .             No douches .             Good to sleep without underwear to let the vaginal area to air out .             No scrubbing: spread the lips to let warm water rinse over labias and pat dry    ____   Increase water intake from 1.5 bottle to 2 bottles First thing morning -room temp   Decrease soda for 2 bottle to 1 bottle   ____   Deep core breathing level 1 ( pillow under buttocks )  10 x 2 ( morning, afternoon, evening)    Logrolling out of bed with arm over head to roll onto armpit    Discontinue your sit up and crunches

## 2018-08-18 NOTE — Addendum Note (Signed)
Addended by: Jerl Mina on: 08/18/2018 10:01 AM   Modules accepted: Orders

## 2018-08-24 ENCOUNTER — Ambulatory Visit (INDEPENDENT_AMBULATORY_CARE_PROVIDER_SITE_OTHER): Payer: BC Managed Care – PPO | Admitting: Family Medicine

## 2018-08-24 ENCOUNTER — Encounter: Payer: Self-pay | Admitting: Family Medicine

## 2018-08-24 ENCOUNTER — Other Ambulatory Visit: Payer: Self-pay

## 2018-08-24 ENCOUNTER — Ambulatory Visit: Payer: BC Managed Care – PPO | Attending: Obstetrics and Gynecology | Admitting: Physical Therapy

## 2018-08-24 VITALS — BP 118/60 | HR 76 | Temp 98.1°F | Ht 62.0 in | Wt 138.5 lb

## 2018-08-24 DIAGNOSIS — Z1239 Encounter for other screening for malignant neoplasm of breast: Secondary | ICD-10-CM | POA: Diagnosis not present

## 2018-08-24 DIAGNOSIS — G43B0 Ophthalmoplegic migraine, not intractable: Secondary | ICD-10-CM | POA: Diagnosis not present

## 2018-08-24 DIAGNOSIS — R29898 Other symptoms and signs involving the musculoskeletal system: Secondary | ICD-10-CM

## 2018-08-24 DIAGNOSIS — M79671 Pain in right foot: Secondary | ICD-10-CM

## 2018-08-24 DIAGNOSIS — M6281 Muscle weakness (generalized): Secondary | ICD-10-CM | POA: Diagnosis present

## 2018-08-24 DIAGNOSIS — Z1211 Encounter for screening for malignant neoplasm of colon: Secondary | ICD-10-CM | POA: Diagnosis not present

## 2018-08-24 DIAGNOSIS — Z0000 Encounter for general adult medical examination without abnormal findings: Secondary | ICD-10-CM | POA: Diagnosis not present

## 2018-08-24 LAB — COMPREHENSIVE METABOLIC PANEL
ALT: 7 U/L (ref 0–35)
AST: 13 U/L (ref 0–37)
Albumin: 3.9 g/dL (ref 3.5–5.2)
Alkaline Phosphatase: 85 U/L (ref 39–117)
BUN: 5 mg/dL — ABNORMAL LOW (ref 6–23)
CO2: 26 mEq/L (ref 19–32)
Calcium: 8.7 mg/dL (ref 8.4–10.5)
Chloride: 107 mEq/L (ref 96–112)
Creatinine, Ser: 0.84 mg/dL (ref 0.40–1.20)
GFR: 68.64 mL/min (ref >60.00)
Glucose, Bld: 89 mg/dL (ref 70–99)
Potassium: 3.7 mEq/L (ref 3.5–5.1)
Sodium: 140 mEq/L (ref 135–145)
Total Bilirubin: 0.4 mg/dL (ref 0.2–1.2)
Total Protein: 6.2 g/dL (ref 6.0–8.3)

## 2018-08-24 MED ORDER — ONDANSETRON 8 MG PO TBDP: 8.0000 mg | ORAL_TABLET | Freq: Three times a day (TID) | ORAL | 0 refills | Status: DC | PRN

## 2018-08-24 NOTE — Therapy (Signed)
[place] MAIN [facility] SERVICES [address] [place], [place], [address] Phone: [phone]   Fax:  [phone]  Physical Therapy Treatment  Patient Details  Name: Lydia Carter MRN: [id number] Date of Birth: [birth year] Referring Provider (PT): Gayla Medicus    Encounter Date: 08/24/2018  PT End of Session - 08/24/18 1447    Visit Number  2    Number of Visits  10    Date for PT Re-Evaluation  10/26/18    PT Start Time  1310    PT Stop Time  1400    PT Time Calculation (min)  50 min    Activity Tolerance  Patient tolerated treatment well;No increased pain    Behavior During Therapy  WFL for tasks assessed/performed       Past Medical History:  Diagnosis Date  . Allergy   . Arthritis   . GERD (gastroesophageal reflux disease)   . History of migraine headaches   . Hyperlipidemia   . Hyperthyroidism   . Tobacco abuse     Past Surgical History:  Procedure Laterality Date  . KNEE SURGERY Left    laproscopic  . TONSILECTOMY, ADENOIDECTOMY, BILATERAL MYRINGOTOMY AND TUBES    . TUBAL LIGATION      There were no vitals filed for this visit.  Subjective Assessment - 08/24/18 1310    Subjective  Pt has been practicing log rolling.    Pertinent History  1 vaginal delivery with episiotomy.    Patient Stated Goals  keep from having surgery for prolapse, be healthier         [facility] PT Assessment - 08/24/18 1314      Palpation   SI assessment   standing L PSIS more posterior     Palpation comment  L lateral border of sacrum hypomobile lacking nutation                    [facility] Adult PT Treatment/Exercise - 08/24/18 1323      Manual Therapy   Manual therapy comments  long axis distraction L, PAMob Grade II, supreior glide, rotational mob to facilitate nutation of L sacral border                   PT Long Term Goals - 08/17/18 1218      PT LONG TERM GOAL #1   Title  Pt will report decreased UTI-like symptoms  from once a week to every 2 weeks in order to restore QOL    Time  5    Period  Weeks    Status  New    Target Date  09/21/18      PT LONG TERM GOAL #2   Title  Pt will be able to report decreased R foot pain by 50% and no nerve pain when sitting at work in order perform work duties.    Time  8    Period  Weeks    Status  New    Target Date  10/12/18      PT LONG TERM GOAL #3   Title  Pt will demo proper body mechanics with bending and lifting and apply co-activation techniques with deep core to minimzie straining pelvic floor and organs.    Time  4    Period  Weeks    Status  New    Target Date  09/14/18      PT LONG TERM GOAL #4   Title  Pt will decrease her  PFIQ from 6% to <3 % in order to restore pelvic floor mm    Time  10    Period  Weeks    Status  New    Target Date  10/26/18      PT LONG TERM GOAL #5   Title  Pt will demo increased strength BLE 5/5 in order to improve gait    Time  10    Period  Weeks    Status  New    Target Date  10/26/18      Additional Long Term Goals   Additional Long Term Goals  Yes      PT LONG TERM GOAL #6   Title  Pt will demo proper pelvic floor contractions with deep core coordination to minimize worsening of prolapse    Time  4    Period  Weeks    Status  New    Target Date  09/14/18      PT LONG TERM GOAL #7   Title  Pt will demo proper body mechanics with coactivation of deep core and lower kinetic chain to mininize straining pelvic floor and pelvic organs    Time  6    Period  Weeks    Status  New    Target Date  09/28/18      PT LONG TERM GOAL #8   Title  Pt will increase her LEFS score from  45/80 pts to >  54/80 pts in order to improve R foot pain and plantar fasciitis  ( MCID 9 pts)    Time  10    Period  Weeks    Status  New    Target Date  10/26/18            Plan - 08/24/18 1447    Clinical Impression Statement  Pt is progressing well. Advanced to deep core level 2, hip abduction strengthening,  thoracolumbar/ glut strengthening. Pt tolerated thoday's session without complaints. Pt demo' d new exercises with moderate cues. Required further training to minimize straining of pelvic floor with log rolling technique. Plan to continue add more strengthening exercises. Pt continues to benefit from skilled PT    Personal Factors and Comorbidities  Age;Comorbidity 3+;Other    Comorbidities  GERD, Arhtritis, hyperlipidemia,    Examination-Activity Limitations  Continence;Toileting;Sit;Stand;Locomotion Level    Stability/Clinical Decision Making  Evolving/Moderate complexity    Rehab Potential  Good    PT Frequency  1x / week    PT Duration  Other (comment)   10   PT Treatment/Interventions  Moist Heat;Functional mobility training;Therapeutic activities;Therapeutic exercise;Manual techniques;Neuromuscular re-education;Taping;Scar mobilization;Gait training;Balance training;Patient/family education    Consulted and Agree with Plan of Care  Patient       Patient will benefit from skilled therapeutic intervention in order to improve the following deficits and impairments:  Decreased mobility, Decreased strength, Postural dysfunction, Improper body mechanics, Pain, Increased muscle spasms, Decreased endurance, Decreased range of motion, Decreased coordination, Difficulty walking, Decreased balance, Hypomobility  Visit Diagnosis: 1. Other symptoms and signs involving the musculoskeletal system   2. Pain in right foot   3. Muscle weakness (generalized)        Problem List Patient Active Problem List   Diagnosis Date Noted  . Elevated blood pressure reading 04/15/2018  . BCC (basal cell carcinoma) 09/07/2017  . GERD (gastroesophageal reflux disease) 01/12/2017  . HLD (hyperlipidemia) 01/12/2017  . Hyperthyroidism 02/28/2016  . Adjustment disorder with mixed anxiety and depressed mood 02/28/2016  . Migraine 07/25/2014  .  Allergic rhinitis 07/25/2014  . Arthritis 07/25/2014    Jerl Mina ,PT, DPT, E-RYT  08/24/2018, 2:48 PM  [place] MAIN [facility] SERVICES [address] [place], [place], [address] Phone: [phone]   Fax:  [phone]  Name: Lydia Carter MRN: [id number] Date of Birth: [birth year]

## 2018-08-24 NOTE — Patient Instructions (Signed)
Try to add daily activity  - consider setting an alarm on your phone to tell you to get up and walk for 2-5 minutes every hour   Preventive Care 48-62 Years Old, Female Preventive care refers to visits with your health care provider and lifestyle choices that can promote health and wellness. This includes:  A yearly physical exam. This may also be called an annual well check.  Regular dental visits and eye exams.  Immunizations.  Screening for certain conditions.  Healthy lifestyle choices, such as eating a healthy diet, getting regular exercise, not using drugs or products that contain nicotine and tobacco, and limiting alcohol use. What can I expect for my preventive care visit? Physical exam Your health care provider will check your:  Height and weight. This may be used to calculate body mass index (BMI), which tells if you are at a healthy weight.  Heart rate and blood pressure.  Skin for abnormal spots. Counseling Your health care provider may ask you questions about your:  Alcohol, tobacco, and drug use.  Emotional well-being.  Home and relationship well-being.  Sexual activity.  Eating habits.  Work and work Statistician.  Method of birth control.  Menstrual cycle.  Pregnancy history. What immunizations do I need?  Influenza (flu) vaccine  This is recommended every year. Tetanus, diphtheria, and pertussis (Tdap) vaccine  You may need a Td booster every 10 years. Varicella (chickenpox) vaccine  You may need this if you have not been vaccinated. Zoster (shingles) vaccine  You may need this after age 72. Measles, mumps, and rubella (MMR) vaccine  You may need at least one dose of MMR if you were born in 1957 or later. You may also need a second dose. Pneumococcal conjugate (PCV13) vaccine  You may need this if you have certain conditions and were not previously vaccinated. Pneumococcal polysaccharide (PPSV23) vaccine  You may need one or two doses  if you smoke cigarettes or if you have certain conditions. Meningococcal conjugate (MenACWY) vaccine  You may need this if you have certain conditions. Hepatitis A vaccine  You may need this if you have certain conditions or if you travel or work in places where you may be exposed to hepatitis A. Hepatitis B vaccine  You may need this if you have certain conditions or if you travel or work in places where you may be exposed to hepatitis B. Haemophilus influenzae type b (Hib) vaccine  You may need this if you have certain conditions. Human papillomavirus (HPV) vaccine  If recommended by your health care provider, you may need three doses over 6 months. You may receive vaccines as individual doses or as more than one vaccine together in one shot (combination vaccines). Talk with your health care provider about the risks and benefits of combination vaccines. What tests do I need? Blood tests  Lipid and cholesterol levels. These may be checked every 5 years, or more frequently if you are over 82 years old.  Hepatitis C test.  Hepatitis B test. Screening  Lung cancer screening. You may have this screening every year starting at age 86 if you have a 30-pack-year history of smoking and currently smoke or have quit within the past 15 years.  Colorectal cancer screening. All adults should have this screening starting at age 62 and continuing until age 69. Your health care provider may recommend screening at age 7 if you are at increased risk. You will have tests every 1-10 years, depending on your results and the  type of screening test.  Diabetes screening. This is done by checking your blood sugar (glucose) after you have not eaten for a while (fasting). You may have this done every 1-3 years.  Mammogram. This may be done every 1-2 years. Talk with your health care provider about when you should start having regular mammograms. This may depend on whether you have a family history of breast  cancer.  BRCA-related cancer screening. This may be done if you have a family history of breast, ovarian, tubal, or peritoneal cancers.  Pelvic exam and Pap test. This may be done every 3 years starting at age 86. Starting at age 38, this may be done every 5 years if you have a Pap test in combination with an HPV test. Other tests  Sexually transmitted disease (STD) testing.  Bone density scan. This is done to screen for osteoporosis. You may have this scan if you are at high risk for osteoporosis. Follow these instructions at home: Eating and drinking  Eat a diet that includes fresh fruits and vegetables, whole grains, lean protein, and low-fat dairy.  Take vitamin and mineral supplements as recommended by your health care provider.  Do not drink alcohol if: ? Your health care provider tells you not to drink. ? You are pregnant, may be pregnant, or are planning to become pregnant.  If you drink alcohol: ? Limit how much you have to 0-1 drink a day. ? Be aware of how much alcohol is in your drink. In the [place] one drink equals one 12 oz bottle of beer (355 mL), one 5 oz glass of wine (148 mL), or one 1 oz glass of hard liquor (44 mL). Lifestyle  Take daily care of your teeth and gums.  Stay active. Exercise for at least 30 minutes on 5 or more days each week.  Do not use any products that contain nicotine or tobacco, such as cigarettes, e-cigarettes, and chewing tobacco. If you need help quitting, ask your health care provider.  If you are sexually active, practice safe sex. Use a condom or other form of birth control (contraception) in order to prevent pregnancy and STIs (sexually transmitted infections).  If told by your health care provider, take low-dose aspirin daily starting at age 63. What's next?  Visit your health care provider once a year for a well check visit.  Ask your health care provider how often you should have your eyes and teeth checked.  Stay up to date  on all vaccines. This information is not intended to replace advice given to you by your health care provider. Make sure you discuss any questions you have with your health care provider. Document Released: 03/02/2015 Document Revised: 10/15/2017 Document Reviewed: 10/15/2017 Elsevier Patient Education  2020 [employer].

## 2018-08-24 NOTE — Progress Notes (Signed)
Annual Exam   Chief Complaint:  Chief Complaint  Patient presents with  . Annual Exam    History of Present Illness:  Ms. Lydia Carter is a 63 y.o. G1P1 who LMP was No LMP recorded. Patient is postmenopausal., presents today for her annual examination.    #cystocele/urethrocele - seeing PT  #Migraine - getting them regularly - uses heat - tried topimax w/o improvement - using zofran prn for severe symptoms - taking tylenol/ibuprofen daily for aches and pains in foot   Nutrition She does get adequate calcium and Vitamin D in her diet - is on Vitamin D replacement. Diet: variety in her diet - veggies, grains, meats, salad  Safety The patient wears seatbelts: yes.     The patient feels safe at home and in their relationships: yes.  She is single partner, contraception - post menopausal status.   Cancer Screening:   Last Pap:   July 2016 Results were: no abnormalities /neg HPV DNA   Breast Cancer Screening There is no FH of breast cancer. There is no FH of ovarian cancer. BRCA screening Not Indicated.  The patient does want a mammogram this year.   Weight Wt Readings from Last 3 Encounters:  08/24/18 138 lb 8 oz (62.8 kg)  04/16/18 141 lb 3.2 oz (64 kg)  04/15/18 142 lb 8 oz (64.6 kg)   Patient has normal BMI  BMI Readings from Last 1 Encounters:  08/24/18 25.33 kg/m     Chronic disease screening Blood pressure monitoring:  BP Readings from Last 3 Encounters:  08/24/18 118/60  04/16/18 120/72  04/15/18 (!) 154/76    Lipid Monitoring: Indication for screening: age >19, obesity, diabetes, family hx, CV risk factors.  Lipid screening: Not Indicated  Lab Results  Component Value Date   CHOL 180 03/09/2017   HDL 37.20 (L) 03/09/2017   LDLCALC 116 (H) 03/09/2017   LDLDIRECT 131.0 02/28/2016   TRIG 136.0 03/09/2017   CHOLHDL 5 03/09/2017   The 10-year ASCVD risk score Mikey Bussing DC Jr., et al., 2013) is: 4%   Values used to calculate the score:     Age: 48  years     Sex: Female     Is Non-Hispanic African American: No     Diabetic: No     Tobacco smoker: No     Systolic Blood Pressure: 449 mmHg     Is BP treated: No     HDL Cholesterol: 37.2 mg/dL     Total Cholesterol: 180 mg/dL    Diabetes Screening: overweight, family hx, PCOS, hx of gestational diabetes, at risk ethnicity Diabetes Screening screening: Not Indicated  No results found for: HGBA1C   Past Medical History:  Diagnosis Date  . Allergy   . Arthritis   . GERD (gastroesophageal reflux disease)   . History of migraine headaches   . Hyperlipidemia   . Hyperthyroidism   . Tobacco abuse     Past Surgical History:  Procedure Laterality Date  . KNEE SURGERY Left    laproscopic  . TONSILECTOMY, ADENOIDECTOMY, BILATERAL MYRINGOTOMY AND TUBES    . TUBAL LIGATION      Prior to Admission medications   Medication Sig Start Date End Date Taking? Authorizing Provider  loteprednol (LOTEMAX) 0.2 % SUSP Apply 1 drop to eye 4 (four) times daily. 04/23/16  Yes [provider]  methimazole (TAPAZOLE) 5 MG tablet 1/2 tab by mouth every other day Patient taking differently: 5 mg. 1 tablet on odd days and 2 tablets  on even days. 09/25/15  Yes Lucille Passy, MD  mometasone (NASONEX) 50 MCG/ACT nasal spray Place 2 sprays into the nose daily as needed.   Yes [provider]  ondansetron (ZOFRAN-ODT) 8 MG disintegrating tablet Take 1 tablet (8 mg total) by mouth every 8 (eight) hours as needed for nausea. 11/20/17  Yes Elby Beck, FNP  Vitamin D, Ergocalciferol, (DRISDOL) 1.25 MG (50000 UT) CAPS capsule Take by mouth. 12/20/17  Yes [provider]  albuterol (PROVENTIL HFA;VENTOLIN HFA) 108 (90 Base) MCG/ACT inhaler  03/11/18   [provider]  valACYclovir (VALTREX) 1000 MG tablet Take 1,000 mg by mouth daily.    [provider]    Allergies  Allergen Reactions  . Atenolol     Made her "emotional and cry"  . Codeine     Gynecologic  History: No LMP recorded. Patient is postmenopausal.  Obstetric History: G1P1  Social History   Socioeconomic History  . Marital status: Married    Spouse name: Not on file  . Number of children: Not on file  . Years of education: Not on file  . Highest education level: Not on file  Occupational History  . Not on file  Social Needs  . Financial resource strain: Not on file  . Food insecurity    Worry: Not on file    Inability: Not on file  . Transportation needs    Medical: Not on file    Non-medical: Not on file  Tobacco Use  . Smoking status: Former Smoker    Quit date: 08/18/2011    Years since quitting: 7.0  . Smokeless tobacco: Never Used  Substance and Sexual Activity  . Alcohol use: Yes  . Drug use: No  . Sexual activity: Yes  Lifestyle  . Physical activity    Days per week: Not on file    Minutes per session: Not on file  . Stress: Not on file  Relationships  . Social Herbalist on phone: Not on file    Gets together: Not on file    Attends religious service: Not on file    Active member of club or organization: Not on file    Attends meetings of clubs or organizations: Not on file    Relationship status: Not on file  . Intimate partner violence    Fear of current or ex partner: Not on file    Emotionally abused: Not on file    Physically abused: Not on file    Forced sexual activity: Not on file  Other Topics Concern  . Not on file  Social History Narrative  . Not on file    Family History  Adopted: Yes    Review of Systems  Constitutional: Negative for chills and fever.  HENT: Negative for congestion and sinus pain.   Eyes: Negative for blurred vision and double vision.  Respiratory: Negative for cough and shortness of breath.   Cardiovascular: Negative for chest pain and leg swelling.  Gastrointestinal: Negative for heartburn, nausea and vomiting.  Genitourinary: Negative for dysuria and urgency.  Musculoskeletal: Positive for  joint pain. Negative for myalgias.  Skin: Negative for itching and rash.  Neurological: Positive for headaches.  Endo/Heme/Allergies: Does not bruise/bleed easily.  Psychiatric/Behavioral: Negative for depression. The patient is not nervous/anxious.      Physical Exam BP 118/60   Pulse 76   Temp 98.1 F (36.7 C)   Ht '5\' 2"'  (1.575 m)   Wt 138 lb  8 oz (62.8 kg)   BMI 25.33 kg/m    BP Readings from Last 3 Encounters:  08/24/18 118/60  04/16/18 120/72  04/15/18 (!) 154/76      Physical Exam Constitutional:      General: She is not in acute distress.    Appearance: She is well-developed. She is not diaphoretic.  HENT:     Head: Normocephalic and atraumatic.     Right Ear: External ear normal.     Left Ear: External ear normal.     Nose: Nose normal.  Eyes:     General: No scleral icterus.    Conjunctiva/sclera: Conjunctivae normal.  Neck:     Musculoskeletal: Neck supple.  Cardiovascular:     Rate and Rhythm: Normal rate and regular rhythm.     Heart sounds: No murmur.  Pulmonary:     Effort: Pulmonary effort is normal. No respiratory distress.     Breath sounds: Normal breath sounds. No wheezing.  Abdominal:     General: Bowel sounds are normal. There is no distension.     Palpations: Abdomen is soft. There is no mass.     Tenderness: There is no abdominal tenderness. There is no guarding or rebound.  Musculoskeletal: Normal range of motion.  Lymphadenopathy:     Cervical: No cervical adenopathy.  Skin:    General: Skin is warm and dry.     Capillary Refill: Capillary refill takes less than 2 seconds.  Neurological:     Mental Status: She is alert and oriented to person, place, and time.     Deep Tendon Reflexes: Reflexes normal.  Psychiatric:        Behavior: Behavior normal.        Results: Alcohol: low risk Depression: low risk    Assessment: 63 y.o. G1P1 female here for routine annual physical examination.  Plan: Problem List Items Addressed  This Visit      Cardiovascular and Mediastinum   Migraine   Relevant Medications   ondansetron (ZOFRAN-ODT) 8 MG disintegrating tablet    Other Visit Diagnoses    Annual physical exam    -  Primary   Relevant Orders   Comprehensive metabolic panel   Screening for malignant neoplasm of colon       Relevant Orders   Ambulatory referral to Gastroenterology   Breast cancer screening       Relevant Orders   MM 3D SCREEN BREAST BILATERAL      Screening: -- Blood pressure screen normal -- Mammogram - patient will call -- Colonoscopy - GI referral -- Weight screening: normal -- Depression screening (PHQ-9): negative -- Nutrition: normal -- cholesterol screening: not due for screening -- osteoporosis screening: not due -- tobacco screening: not using -- alcohol screening:  low-risk usage. -- family history of breast cancer screening: done. not at high risk. -- no evidence of domestic violence or intimate partner violence. -- pap smear not collected per ASCCP guidelines -- flu vaccine will get in fall -- Will check with Endocrinology regarding shingles vaccine, advised calling back   Lesleigh Noe

## 2018-08-24 NOTE — Patient Instructions (Addendum)
1) R sidelying  Clams 10 reps on L  Pillow between knees   2) on back,  Pillow under hips and abck Deep core level 1 and 2   Remove pillow  3) L sidelying  Clams  16 reps on R Pillow between knees   4) on back  Figure 4 and cross over thigh stretches 5 breaths both legs   5)  R sidelying  Clams 10 reps on L ( 2nd set)   Pillow between knees   6) on back  Figure 4 and cross over thigh stretches 5 breaths both legs   7)  R sidelying  Clams 10 reps on L ( 3nd set)   Pillow between knees   ____ Hands on counter:  Tap one foot back on the floor, lean forward, opposite hand up ( thumbs up)  Place foot back under hips  Switch  6 min

## 2018-08-31 ENCOUNTER — Ambulatory Visit: Payer: BC Managed Care – PPO | Admitting: Physical Therapy

## 2018-09-07 ENCOUNTER — Ambulatory Visit: Payer: BC Managed Care – PPO | Admitting: Physical Therapy

## 2018-09-14 ENCOUNTER — Ambulatory Visit: Payer: BC Managed Care – PPO | Admitting: Physical Therapy

## 2018-09-21 ENCOUNTER — Other Ambulatory Visit: Payer: Self-pay

## 2018-09-21 ENCOUNTER — Ambulatory Visit: Payer: BC Managed Care – PPO | Attending: Obstetrics and Gynecology | Admitting: Physical Therapy

## 2018-09-21 DIAGNOSIS — R29898 Other symptoms and signs involving the musculoskeletal system: Secondary | ICD-10-CM | POA: Diagnosis present

## 2018-09-21 DIAGNOSIS — M79671 Pain in right foot: Secondary | ICD-10-CM | POA: Diagnosis present

## 2018-09-21 DIAGNOSIS — M6281 Muscle weakness (generalized): Secondary | ICD-10-CM | POA: Insufficient documentation

## 2018-09-21 DIAGNOSIS — R2689 Other abnormalities of gait and mobility: Secondary | ICD-10-CM | POA: Insufficient documentation

## 2018-09-21 NOTE — Patient Instructions (Addendum)
3- point taps  10 reps     Deep core level 1  10   Level  2 6 min   ___ Pick up grandbaby with proper squat ( knee behind toes)  In hale and then exhale to lift   ___

## 2018-09-21 NOTE — Therapy (Signed)
[place] MAIN [facility] SERVICES [address] [place], [place], [address] Phone: [phone]   Fax:  [phone]  Physical Therapy Treatment  Patient Details  Name: Lydia Carter MRN: [id number] Date of Birth: 1955-06-29 Referring Provider (PT): Gayla Medicus    Encounter Date: 09/21/2018  PT End of Session - 09/21/18 1352    Visit Number  3    Number of Visits  10    Date for PT Re-Evaluation  10/26/18    PT Start Time  7782    PT Stop Time  1400    PT Time Calculation (min)  57 min    Activity Tolerance  Patient tolerated treatment well;No increased pain    Behavior During Therapy  WFL for tasks assessed/performed       Past Medical History:  Diagnosis Date  . Allergy   . Arthritis   . GERD (gastroesophageal reflux disease)   . History of migraine headaches   . Hyperlipidemia   . Hyperthyroidism   . Tobacco abuse     Past Surgical History:  Procedure Laterality Date  . KNEE SURGERY Left    laproscopic  . TONSILECTOMY, ADENOIDECTOMY, BILATERAL MYRINGOTOMY AND TUBES    . TUBAL LIGATION      There were no vitals filed for this visit.  Subjective Assessment - 09/21/18 1308    Subjective  Pt reported feeling less pressure senations the past 2 weeks even with coughing.  The only time she felt the pressure sensation was with picking up 15 lb grandbaby. Pt was sick and skipped last week's appt. At the last session 2 weeks ago, pt noticed she could not lift the leg ad it hurt to take a step after the Tx. Today, she felt it is still difficult to bend but is alot better. Pt felt her L hip feel alot better.    Pertinent History  1 vaginal delivery with episiotomy.    Patient Stated Goals  keep from having surgery for prolapse, be healthier         [facility] PT Assessment - 09/21/18 1346      Coordination   Gross Motor Movements are Fluid and Coordinated  --   mild cues for deep core coordination     Strength   Overall Strength Comments   BLE 4-/5       Palpation   SI assessment   R glut med tight, L SIJ hypomobile. lacking nutation                   [facility] PT Treatment/Exercise - 09/21/18 1348      Neuro Re-ed    Neuro Re-ed Details   mild cues for deep core coordination and strengthening       Modalities   Modalities  Cryotherapy;Moist Heat      Moist Heat Therapy   Number Minutes Moist Heat  10 Minutes   during review of deep core level 1-2 instructions    Moist Heat Location  --   gluts/ sacral through clothing     Manual Therapy   Manual therapy comments  long axis distraction, sidelying rotational mob, PA mob along L SIJ, STM at R hamstring proximal to distal                   PT Long Term Goals - 08/17/18 1218      PT LONG TERM GOAL #1   Title  Pt will report decreased UTI-like symptoms from once a  week to every 2 weeks in order to restore QOL    Time  5    Period  Weeks    Status  New    Target Date  09/21/18      PT LONG TERM GOAL #2   Title  Pt will be able to report decreased R foot pain by 50% and no nerve pain when sitting at work in order perform work duties.    Time  8    Period  Weeks    Status  New    Target Date  10/12/18      PT LONG TERM GOAL #3   Title  Pt will demo proper body mechanics with bending and lifting and apply co-activation techniques with deep core to minimzie straining pelvic floor and organs.    Time  4    Period  Weeks    Status  New    Target Date  09/14/18      PT LONG TERM GOAL #4   Title  Pt will decrease her PFIQ from 6% to <3 % in order to restore pelvic floor mm    Time  10    Period  Weeks    Status  New    Target Date  10/26/18      PT LONG TERM GOAL #5   Title  Pt will demo increased strength BLE 5/5 in order to improve gait    Time  10    Period  Weeks    Status  New    Target Date  10/26/18      Additional Long Term Goals   Additional Long Term Goals  Yes      PT LONG TERM GOAL #6   Title  Pt will demo proper  pelvic floor contractions with deep core coordination to minimize worsening of prolapse    Time  4    Period  Weeks    Status  New    Target Date  09/14/18      PT LONG TERM GOAL #7   Title  Pt will demo proper body mechanics with coactivation of deep core and lower kinetic chain to mininize straining pelvic floor and pelvic organs    Time  6    Period  Weeks    Status  New    Target Date  09/28/18      PT LONG TERM GOAL #8   Title  Pt will increase her LEFS score from  45/80 pts to >  54/80 pts in order to improve R foot pain and plantar fasciitis  ( MCID 9 pts)    Time  10    Period  Weeks    Status  New    Target Date  10/26/18            Plan - 09/21/18 1352    Clinical Impression Statement  Pt has  Reported decreased pressure sensation across the past 2 weeks except with lifting grandbaby. This signifies a good amount of progress. Continued to apply external manual Tx to increase L SIJ mobility/ glut med and decrease R hamstring tightness. Pt reported 70-80% improvement in R posterior thigh post Tx. Pt demo'd proper squat technique and co-activaiton of deep core with lifting 15 lb kettle bell to simulate lifting of granddbaby. Pt reported pressure sensation after 3 reps which indicates pt continues to require further strengthening to minimzie worsening of prolapse.  Pt continues to benefit from skilled PT.    Personal Factors  and Comorbidities  Age;Comorbidity 3+;Other    Comorbidities  GERD, Arhtritis, hyperlipidemia,    Examination-Activity Limitations  Continence;Toileting;Sit;Stand;Locomotion Level    Stability/Clinical Decision Making  Evolving/Moderate complexity    Rehab Potential  Good    PT Frequency  1x / week    PT Duration  Other (comment)   10   PT Treatment/Interventions  Moist Heat;Functional mobility training;Therapeutic activities;Therapeutic exercise;Manual techniques;Neuromuscular re-education;Taping;Scar mobilization;Gait training;Balance  training;Patient/family education    Consulted and Agree with Plan of Care  Patient       Patient will benefit from skilled therapeutic intervention in order to improve the following deficits and impairments:  Decreased mobility, Decreased strength, Postural dysfunction, Improper body mechanics, Pain, Increased muscle spasms, Decreased endurance, Decreased range of motion, Decreased coordination, Difficulty walking, Decreased balance, Hypomobility  Visit Diagnosis: 1. Other symptoms and signs involving the musculoskeletal system   2. Pain in right foot   3. Muscle weakness (generalized)        Problem List Patient Active Problem List   Diagnosis Date Noted  . Elevated blood pressure reading 04/15/2018  . BCC (basal cell carcinoma) 09/07/2017  . GERD (gastroesophageal reflux disease) 01/12/2017  . HLD (hyperlipidemia) 01/12/2017  . Hyperthyroidism 02/28/2016  . Adjustment disorder with mixed anxiety and depressed mood 02/28/2016  . Migraine 07/25/2014  . Allergic rhinitis 07/25/2014  . Arthritis 07/25/2014    Jerl Mina ,PT, DPT, E-RYT  09/21/2018, 2:02 PM  [place] [facility]IN [facility] SERVICES [address] [place], [place], [address] Phone: [phone]   Fax:  [phone]  Name: SUSAN BLEICH MRN: [id number] Date of Birth: 05-11-1955

## 2018-09-28 ENCOUNTER — Other Ambulatory Visit: Payer: Self-pay

## 2018-09-28 ENCOUNTER — Ambulatory Visit: Payer: BC Managed Care – PPO | Admitting: Physical Therapy

## 2018-09-28 DIAGNOSIS — R29898 Other symptoms and signs involving the musculoskeletal system: Secondary | ICD-10-CM | POA: Diagnosis not present

## 2018-09-28 DIAGNOSIS — M79671 Pain in right foot: Secondary | ICD-10-CM

## 2018-09-28 DIAGNOSIS — M6281 Muscle weakness (generalized): Secondary | ICD-10-CM

## 2018-09-28 NOTE — Therapy (Addendum)
[place] MAIN [facility] SERVICES [address] [place], [place], [address] Phone: [phone]   Fax:  [phone]  Physical Therapy Treatment  Patient Details  Name: Lydia Carter MRN: [id number] Date of Birth: 05-03-1955 Referring Provider (PT): Gayla Medicus    Encounter Date: 09/28/2018  PT End of Session - 09/28/18 1847    Visit Number  4    Number of Visits  10    Date for PT Re-Evaluation  10/26/18    PT Start Time  1600    PT Stop Time  1700    PT Time Calculation (min)  60 min    Activity Tolerance  Patient tolerated treatment well;No increased pain    Behavior During Therapy  WFL for tasks assessed/performed       Past Medical History:  Diagnosis Date  . Allergy   . Arthritis   . GERD (gastroesophageal reflux disease)   . History of migraine headaches   . Hyperlipidemia   . Hyperthyroidism   . Tobacco abuse     Past Surgical History:  Procedure Laterality Date  . KNEE SURGERY Left    laproscopic  . TONSILECTOMY, ADENOIDECTOMY, BILATERAL MYRINGOTOMY AND TUBES    . TUBAL LIGATION      There were no vitals filed for this visit.  Subjective Assessment - 09/28/18 1607    Subjective  Pt reported has been  feeling less prolapse pressure but today feels more noticable today compared to the other days the past week. Pt did not do anything different. Pt still has a sore spot in the L buttock area    Pertinent History  1 vaginal delivery with episiotomy.    Patient Stated Goals  keep from having surgery for prolapse, be healthier         [facility] PT Assessment - 09/28/18 1649      Lunges   Comments  poor propioception of back leg , narrow BOS       Other:   Other/ Comments  simulated mopping: excessive use of arms, spinal flexion .        Palpation   SI assessment   L PSIS more posterior     Palpation comment                   Pelvic Floor Special Questions - 09/28/18 1650    External Perineal Exam  5 sec, 3 reps  before fatigue Grade 4/5        [facility] Adult PT Treatment/Exercise - 09/28/18 1653      Therapeutic Activites    Therapeutic Activities  --   body mecahnics with mopping to minimize pelvic obliquities     Neuro Re-ed    Neuro Re-ed Details   proper leg alignment withmopping to minimzie obliquities       Moist Heat Therapy   Number Minutes Moist Heat  5 Minutes    Moist Heat Location  --   glutes during exercises     Manual Therapy   Manual therapy comments  FADDIR/ ER with AP mob Grade II on L                  PT Long Term Goals - 08/17/18 1218      PT LONG TERM GOAL #1   Title  Pt will report decreased UTI-like symptoms from once a week to every 2 weeks in order to restore QOL    Time  5    Period  Weeks  Status  New    Target Date  09/21/18      PT LONG TERM GOAL #2   Title  Pt will be able to report decreased R foot pain by 50% and no nerve pain when sitting at work in order perform work duties.    Time  8    Period  Weeks    Status  New    Target Date  10/12/18      PT LONG TERM GOAL #3   Title  Pt will demo proper body mechanics with bending and lifting and apply co-activation techniques with deep core to minimzie straining pelvic floor and organs.    Time  4    Period  Weeks    Status  New    Target Date  09/14/18      PT LONG TERM GOAL #4   Title  Pt will decrease her PFIQ from 6% to <3 % in order to restore pelvic floor mm    Time  10    Period  Weeks    Status  New    Target Date  10/26/18      PT LONG TERM GOAL #5   Title  Pt will demo increased strength BLE 5/5 in order to improve gait    Time  10    Period  Weeks    Status  New    Target Date  10/26/18      Additional Long Term Goals   Additional Long Term Goals  Yes      PT LONG TERM GOAL #6   Title  Pt will demo proper pelvic floor contractions with deep core coordination to minimize worsening of prolapse    Time  4    Period  Weeks    Status  New    Target Date  09/14/18       PT LONG TERM GOAL #7   Title  Pt will demo proper body mechanics with coactivation of deep core and lower kinetic chain to mininize straining pelvic floor and pelvic organs    Time  6    Period  Weeks    Status  New    Target Date  09/28/18      PT LONG TERM GOAL #8   Title  Pt will increase her LEFS score from  45/80 pts to >  54/80 pts in order to improve R foot pain and plantar fasciitis  ( MCID 9 pts)    Time  10    Period  Weeks    Status  New    Target Date  10/26/18            Plan - 09/28/18 1848    Clinical Impression Statement  Pt showed pelvic obliquties today which was corrected with manual Tx that pt tolerated without increased pain. Pt demo'd poor propioception of BLE in simulated mopping which is likely associated with her pelvic obliquties. Ptovided body mechanics training to minimize back pain and minimizing worsening of prolapse. Provided excessive cuing for more co-activation of BLE in functional tasks. Discussed proper shoe support , minimizing use of orthotics which causes more supination/ decreased pelvic girdle closure/ less postural stability. Lower Kinetic chain associated impact LBP/ pelvic issues. Plan to work on improving plantar fascia issue in upcoming session. Pt continues to benefit from skilled PT.    Personal Factors and Comorbidities  Age;Comorbidity 3+;Other    Comorbidities  GERD, Arhtritis, hyperlipidemia,    Examination-Activity Limitations  Continence;Toileting;Sit;Stand;Locomotion Level  Stability/Clinical Decision Making  Evolving/Moderate complexity    Rehab Potential  Good    PT Frequency  1x / week    PT Duration  Other (comment)   10   PT Treatment/Interventions  Moist Heat;Functional mobility training;Therapeutic activities;Therapeutic exercise;Manual techniques;Neuromuscular re-education;Taping;Scar mobilization;Gait training;Balance training;Patient/family education    Consulted and Agree with Plan of Care  Patient        Patient will benefit from skilled therapeutic intervention in order to improve the following deficits and impairments:  Decreased mobility, Decreased strength, Postural dysfunction, Improper body mechanics, Pain, Increased muscle spasms, Decreased endurance, Decreased range of motion, Decreased coordination, Difficulty walking, Decreased balance, Hypomobility  Visit Diagnosis: 1. Other symptoms and signs involving the musculoskeletal system   2. Pain in right foot   3. Muscle weakness (generalized)        Problem List Patient Active Problem List   Diagnosis Date Noted  . Elevated blood pressure reading 04/15/2018  . BCC (basal cell carcinoma) 09/07/2017  . GERD (gastroesophageal reflux disease) 01/12/2017  . HLD (hyperlipidemia) 01/12/2017  . Hyperthyroidism 02/28/2016  . Adjustment disorder with mixed anxiety and depressed mood 02/28/2016  . Migraine 07/25/2014  . Allergic rhinitis 07/25/2014  . Arthritis 07/25/2014    Jerl Mina ,PT, DPT, E-RYT  09/28/2018, 6:52 PM  [place] MAIN [facility] SERVICES [address] [place], [place], [address] Phone: [phone]   Fax:  [phone]  Name: Lydia Carter MRN: [id number] Date of Birth: 07-13-1955

## 2018-09-28 NOTE — Patient Instructions (Signed)
Add long pelvic floor holds 5 sec, 2 rest breath x 3 reps    Before deep core 1 and 2  And After    Mid day   ____  Prone Heel Press for strengthening sacro-iliac joints  1. Lie on your belly. If you have an arch in your low back or it feels umcomfortable, place a pillow under your low belly/hips to make sure your low back feel comfortable.   2. Place our forehead on top of your palms.      Widen your knees apart for starting position.   3. Inhale, feel belly and low back expand  4. Exhale, feel belly hug in, press heel together and count aloud for 5 sec. Then relax the heel squeezing.  Perform 10 reps of 5 sec holds. 2 sets/ day.    If you feel entire buttock tighten too much or feel low back pain, apply 50% less effort. As you press your heel together, you will feel as if your pubic bone (front of your pelvis) and sacrum (back of your pelvis) gentle move towards each other or your low abdominal muscles hug in more.       ____  Body mechanics ( handout) mopping technique

## 2018-09-29 ENCOUNTER — Encounter: Payer: Self-pay | Admitting: *Deleted

## 2018-10-05 ENCOUNTER — Other Ambulatory Visit: Payer: Self-pay

## 2018-10-05 ENCOUNTER — Ambulatory Visit: Payer: BC Managed Care – PPO | Admitting: Physical Therapy

## 2018-10-05 DIAGNOSIS — M6281 Muscle weakness (generalized): Secondary | ICD-10-CM

## 2018-10-05 DIAGNOSIS — R29898 Other symptoms and signs involving the musculoskeletal system: Secondary | ICD-10-CM

## 2018-10-05 DIAGNOSIS — M79671 Pain in right foot: Secondary | ICD-10-CM

## 2018-10-05 NOTE — Patient Instructions (Addendum)
Do pelvic floor exercises , deep core 1, 2 on the floor in the MORNING, NIGHT, and pelvic floor in afternoon   Lunges for picking up light objects from floor  Add mini standing lunges with hand on dresser or table  20 x reps    Start with ski track stance, step back with heel/ toes turned straight, front knee above ankle without moving as your back knee bends and straightens  Keep pelvis and trunk centered between legs without shifting more to the front legs       Transition from standing to floor  Wide squat in front of a CHAIR  Forearms onto the chair Lower knees down into double kneeling   floor   To get up Walk on your knees to the front of chair again Forearms onto the chair Inhale, exhale, pushing onto the forearms to life  lifts hips up, kneeping knees bent hands on thighs, then hips then pause HERE  to avoid (moving too quickly up/ blood rush) -->  knees glide forward and roll  Hips up instead of hinging spine up   * KEEP YOUR HEAD AND HEART LEVELLED , NEVER LETTING YOUR HEAD GET BELOW YOUR HEART   ______  After walking:  seated hamstring / quad   stretch

## 2018-10-05 NOTE — Therapy (Signed)
[place] MAIN [facility] SERVICES [address] [place], [place], [address] Phone: [phone]   Fax:  [phone]  Physical Therapy Treatment  Patient Details  Name: Lydia Carter MRN: [id number] Date of Birth: [birth year] Referring Provider (PT): Gayla Medicus    Encounter Date: 10/05/2018  PT End of Session - 10/05/18 1440    Visit Number  5    Number of Visits  10    Date for PT Re-Evaluation  10/26/18    PT Start Time  6333    PT Stop Time  1410    PT Time Calculation (min)  67 min    Activity Tolerance  Patient tolerated treatment well;No increased pain    Behavior During Therapy  WFL for tasks assessed/performed       Past Medical History:  Diagnosis Date  . Allergy   . Arthritis   . GERD (gastroesophageal reflux disease)   . History of migraine headaches   . Hyperlipidemia   . Hyperthyroidism   . Tobacco abuse     Past Surgical History:  Procedure Laterality Date  . KNEE SURGERY Left    laproscopic  . TONSILECTOMY, ADENOIDECTOMY, BILATERAL MYRINGOTOMY AND TUBES    . TUBAL LIGATION      There were no vitals filed for this visit.  Subjective Assessment - 10/05/18 1309    Subjective  Pt noticed when she wore her orthotics she rolled onto her R ankle. It hurt. Pt switched to older orthotics that were flatter and she had no issues with ankles nor her hips.  Pt was not able to do her HEP due to being busy.    Pertinent History  1 vaginal delivery with episiotomy.    Patient Stated Goals  keep from having surgery for prolapse, be healthier         [facility] PT Assessment - 10/05/18 1438      Palpation   SI assessment   levelled PSIS       Ambulation/Gait   Gait Comments  less scissoring gait, cued for armswing , more anterior COM                    [facility] Adult PT Treatment/Exercise - 10/05/18 1438      Therapeutic Activites    Therapeutic Activities  Other Therapeutic Activities    Other Therapeutic Activities   to floor t/f , lifting light objects, gait training       Neuro Re-ed    Neuro Re-ed Details   cued for lower kinetic chain co-activation for balance and deep core       Exercises   Exercises  --   see pt Tx     Tx:   3 trials with stand<> floor with chair, forearms propped on chair   hip ext - 20 rep with cued for hip propioception with BUE on chair   modified birddog with single UE on chair 2 min    tug a war 12 lb with cues for lower kinetic chain co-activation  4 reps     leg press 90 lb 20 reps    gait training against resistance band 100 ft x 4 reps    Hamstring and quad stretch standing but required seated modifications             PT Long Term Goals - 10/05/18 1446      PT LONG TERM GOAL #1   Title  Pt will report decreased UTI-like symptoms from once  a week to every 2 weeks in order to restore QOL    Time  5    Period  Weeks    Status  On-going      PT LONG TERM GOAL #2   Title  Pt will be able to report decreased R foot pain by 50% and no nerve pain when sitting at work in order perform work duties.    Time  8    Period  Weeks    Status  Achieved      PT LONG TERM GOAL #3   Title  Pt will demo proper body mechanics with bending and lifting and apply co-activation techniques with deep core to minimzie straining pelvic floor and organs.    Time  4    Period  Weeks    Status  Partially Met      PT LONG TERM GOAL #4   Title  Pt will decrease her PFIQ from 6% to <3 % in order to restore pelvic floor mm    Time  10    Period  Weeks    Status  On-going      PT LONG TERM GOAL #5   Title  Pt will demo increased strength BLE 5/5 in order to improve gait    Time  10    Period  Weeks    Status  On-going      PT LONG TERM GOAL #6   Title  Pt will demo proper pelvic floor contractions with deep core coordination to minimize worsening of prolapse    Time  4    Period  Weeks    Status  On-going      PT LONG TERM GOAL #7   Title  Pt will demo proper  body mechanics with coactivation of deep core and lower kinetic chain to mininize straining pelvic floor and pelvic organs    Time  6    Period  Weeks    Status  Partially Met      PT LONG TERM GOAL #8   Title  Pt will increase her LEFS score from  45/80 pts to >  54/80 pts in order to improve R foot pain and plantar fasciitis  ( MCID 9 pts)    Time  10    Period  Weeks    Status  On-going            Plan - 10/05/18 1440    Clinical Impression Statement  Pt demo'd significantly less gait deviations when she removed her orthotics with high arches and replaced it with a flatter orthotic insole. Advanced pt to body mechanics to minimize straining pelvic floor for lifting light objects off floor with lunge position. Due to pt's balance difficulty, golfer's lift was not appropriate for pt. Pt also required downgrade for t/f to floor with use of a chair into double kneeling position/ use of BUE on chair. Focused propioception training for more equal weight bearing, knee alignment, and more anterior COM. Pt demo'd properly with training. Discussed compliance strategies which pt voiced willingness to follow.  Pt continues to benefit from skilled PT   Personal Factors and Comorbidities  Age;Comorbidity 3+;Other    Comorbidities  GERD, Arhtritis, hyperlipidemia,    Examination-Activity Limitations  Continence;Toileting;Sit;Stand;Locomotion Level    Stability/Clinical Decision Making  Evolving/Moderate complexity    Rehab Potential  Good    PT Frequency  1x / week    PT Duration  Other (comment)   10   PT  Treatment/Interventions  Moist Heat;Functional mobility training;Therapeutic activities;Therapeutic exercise;Manual techniques;Neuromuscular re-education;Taping;Scar mobilization;Gait training;Balance training;Patient/family education    Consulted and Agree with Plan of Care  Patient       Patient will benefit from skilled therapeutic intervention in order to improve the following deficits and  impairments:  Decreased mobility, Decreased strength, Postural dysfunction, Improper body mechanics, Pain, Increased muscle spasms, Decreased endurance, Decreased range of motion, Decreased coordination, Difficulty walking, Decreased balance, Hypomobility  Visit Diagnosis: 1. Other symptoms and signs involving the musculoskeletal system   2. Pain in right foot   3. Muscle weakness (generalized)        Problem List Patient Active Problem List   Diagnosis Date Noted  . Elevated blood pressure reading 04/15/2018  . BCC (basal cell carcinoma) 09/07/2017  . GERD (gastroesophageal reflux disease) 01/12/2017  . HLD (hyperlipidemia) 01/12/2017  . Hyperthyroidism 02/28/2016  . Adjustment disorder with mixed anxiety and depressed mood 02/28/2016  . Migraine 07/25/2014  . Allergic rhinitis 07/25/2014  . Arthritis 07/25/2014    Jerl Mina ,PT, DPT, E-RYT  10/05/2018, 2:47 PM  [place] MAIN [facility] SERVICES [address] [place], [place], [address] Phone: [phone]   Fax:  [phone]  Name: NAVADA OSTERHOUT MRN: [id number] Date of Birth: 23-Sep-1955

## 2018-10-12 ENCOUNTER — Ambulatory Visit: Payer: BC Managed Care – PPO | Admitting: Physical Therapy

## 2018-10-12 ENCOUNTER — Other Ambulatory Visit: Payer: Self-pay

## 2018-10-12 DIAGNOSIS — R29898 Other symptoms and signs involving the musculoskeletal system: Secondary | ICD-10-CM

## 2018-10-12 DIAGNOSIS — M79671 Pain in right foot: Secondary | ICD-10-CM

## 2018-10-12 DIAGNOSIS — M6281 Muscle weakness (generalized): Secondary | ICD-10-CM

## 2018-10-12 NOTE — Patient Instructions (Signed)
  Exercise check off at work   [place] Fri  Sat Sun  9am Leg lifts 10rep x 2          10 am Heel raises  5 reps x 2          Before meals  Quick 7 quick squeezes  1 sec               Mon Tue Wed Thurs Fri  Sat Sun  Morning Lunch Evening Long holds 5 sec, x 3 sets         Morning Evening  Deep core 1 and 2          Morning Evening Bridges  10 x 3           Morning Evening A to W with red band   10 x 3

## 2018-10-12 NOTE — Therapy (Signed)
[place] MAIN [facility] SERVICES [address] [place], [place], [address] Phone: [phone]   Fax:  [phone]  Physical Therapy Treatment  Patient Details  Name: Lydia Carter MRN: [id number] Date of Birth: [birth year] Referring Provider (PT): Gayla Medicus    Encounter Date: 10/12/2018  PT End of Session - 10/12/18 1353    Visit Number  6    Number of Visits  10    Date for PT Re-Evaluation  10/26/18    PT Start Time  1300    PT Stop Time  2800    PT Time Calculation (min)  53 min    Activity Tolerance  Patient tolerated treatment well;No increased pain    Behavior During Therapy  WFL for tasks assessed/performed       Past Medical History:  Diagnosis Date  . Allergy   . Arthritis   . GERD (gastroesophageal reflux disease)   . History of migraine headaches   . Hyperlipidemia   . Hyperthyroidism   . Tobacco abuse     Past Surgical History:  Procedure Laterality Date  . KNEE SURGERY Left    laproscopic  . TONSILECTOMY, ADENOIDECTOMY, BILATERAL MYRINGOTOMY AND TUBES    . TUBAL LIGATION      There were no vitals filed for this visit.  Subjective Assessment - 10/12/18 1307    Subjective  Pt reported her pressure feeling from the prolapse is coming and going. It does not occur everyday anymore. Pt feels like the more she drinks water, the more the prolapse feeling comes on because she is peeing all the time. Pt realized she drank sweet tea on Sunday and she went to the bathroom more often. Pt also feels the prolapse more when she is in the shower. Pt has been doing more housework than usual. Pt 's R heel pain has been good for the past 2 weeks since she switched out her insoles.    Pertinent History  1 vaginal delivery with episiotomy.    Patient Stated Goals  keep from having surgery for prolapse, be healthier         [facility] PT Assessment - 10/12/18 1319      Observation/Other Assessments   Observations  significantly decreased  thoracic kyphosis       Other:   Other/ Comments  bridges with minor cues       Strength   Overall Strength Comments  hip ext in prone 3+/5 B, hip abd 4-/5 B                  Pelvic Floor Special Questions - 10/12/18 1345    External Perineal Exam  Seated quick 7 reps 1 sec.         [facility] Adult PT Treatment/Exercise - 10/12/18 1435      Neuro Re-ed    Neuro Re-ed Details   cued for alignment/ technique with new HEP       Exercises   Exercises  --   see pt instructions                 PT Long Term Goals - 10/05/18 1446      PT LONG TERM GOAL #1   Title  Pt will report decreased UTI-like symptoms from once a week to every 2 weeks in order to restore QOL    Time  5    Period  Weeks    Status  On-going      PT LONG TERM GOAL #  2   Title  Pt will be able to report decreased R foot pain by 50% and no nerve pain when sitting at work in order perform work duties.    Time  8    Period  Weeks    Status  Achieved      PT LONG TERM GOAL #3   Title  Pt will demo proper body mechanics with bending and lifting and apply co-activation techniques with deep core to minimzie straining pelvic floor and organs.    Time  4    Period  Weeks    Status  Partially Met      PT LONG TERM GOAL #4   Title  Pt will decrease her PFIQ from 6% to <3 % in order to restore pelvic floor mm    Time  10    Period  Weeks    Status  On-going      PT LONG TERM GOAL #5   Title  Pt will demo increased strength BLE 5/5 in order to improve gait    Time  10    Period  Weeks    Status  On-going      PT LONG TERM GOAL #6   Title  Pt will demo proper pelvic floor contractions with deep core coordination to minimize worsening of prolapse    Time  4    Period  Weeks    Status  On-going      PT LONG TERM GOAL #7   Title  Pt will demo proper body mechanics with coactivation of deep core and lower kinetic chain to mininize straining pelvic floor and pelvic organs    Time  6    Period   Weeks    Status  Partially Met      PT LONG TERM GOAL #8   Title  Pt will increase her LEFS score from  45/80 pts to >  54/80 pts in order to improve R foot pain and plantar fasciitis  ( MCID 9 pts)    Time  10    Period  Weeks    Status  On-going            Plan - 10/12/18 1353    Clinical Impression Statement  Pt showed improved hip abduction strength but continues to need hip extension strengthening. Progressed pt to bridging exercises to increase hip extension strength and promote more cranial position of pelvic organs to minimize prolapse Sx. Progressed with thoracolumbar strengthening to promote strength with lifting grandson       ( 25 lb). Pt is progressing well with pelvic floor strengthening with quick and endurance contractions. Pt continues to benefit from skilled PT.   Personal Factors and Comorbidities  Age;Comorbidity 3+;Other    Comorbidities  GERD, Arhtritis, hyperlipidemia,    Examination-Activity Limitations  Continence;Toileting;Sit;Stand;Locomotion Level    Stability/Clinical Decision Making  Evolving/Moderate complexity    Rehab Potential  Good    PT Frequency  1x / week    PT Duration  Other (comment)   10   PT Treatment/Interventions  Moist Heat;Functional mobility training;Therapeutic activities;Therapeutic exercise;Manual techniques;Neuromuscular re-education;Taping;Scar mobilization;Gait training;Balance training;Patient/family education    Consulted and Agree with Plan of Care  Patient       Patient will benefit from skilled therapeutic intervention in order to improve the following deficits and impairments:  Decreased mobility, Decreased strength, Postural dysfunction, Improper body mechanics, Pain, Increased muscle spasms, Decreased endurance, Decreased range of motion, Decreased coordination, Difficulty walking, Decreased balance, Hypomobility  Visit Diagnosis: Other symptoms and signs involving the musculoskeletal system  Pain in right  foot  Muscle weakness (generalized)     Problem List Patient Active Problem List   Diagnosis Date Noted  . Elevated blood pressure reading 04/15/2018  . BCC (basal cell carcinoma) 09/07/2017  . GERD (gastroesophageal reflux disease) 01/12/2017  . HLD (hyperlipidemia) 01/12/2017  . Hyperthyroidism 02/28/2016  . Adjustment disorder with mixed anxiety and depressed mood 02/28/2016  . Migraine 07/25/2014  . Allergic rhinitis 07/25/2014  . Arthritis 07/25/2014    Jerl Mina ,PT, DPT, E-RYT  10/12/2018, 2:36 PM  [place] MAIN [facility] SERVICES [address] [place], [place], [address] Phone: [phone]   Fax:  [phone]  Name: Lydia Carter MRN: [id number] Date of Birth: Oct 27, 1955

## 2018-10-28 ENCOUNTER — Ambulatory Visit: Payer: BC Managed Care – PPO | Admitting: Family Medicine

## 2018-10-28 ENCOUNTER — Other Ambulatory Visit: Payer: Self-pay

## 2018-10-28 ENCOUNTER — Encounter: Payer: Self-pay | Admitting: Family Medicine

## 2018-10-28 VITALS — BP 140/72 | HR 98 | Temp 98.8°F | Ht 62.0 in | Wt 138.5 lb

## 2018-10-28 DIAGNOSIS — N644 Mastodynia: Secondary | ICD-10-CM | POA: Diagnosis not present

## 2018-10-28 DIAGNOSIS — N63 Unspecified lump in unspecified breast: Secondary | ICD-10-CM | POA: Diagnosis not present

## 2018-10-28 DIAGNOSIS — Z23 Encounter for immunization: Secondary | ICD-10-CM | POA: Diagnosis not present

## 2018-10-28 NOTE — Progress Notes (Signed)
Lydia Brisbon T. Merion Grimaldo, MD Primary Care and [place] at [facility] [place], [address] Phone: [phone]  FAX: [phone]  Lydia Carter - 63 y.o. female  MRN [id number]  Date of Birth: September 19, 1955  Visit Date: 10/28/2018  PCP: Lesleigh Noe, MD  Referred by: Lesleigh Noe, MD  Chief Complaint  Patient presents with  . Shoulder Pain    Right   Subjective:   Lydia Carter is a 63 y.o. very pleasant female patient with Body mass index is 25.33 kg/m. who presents with the following:  3 weeks ago went to [facility] for rehab.  Noticed in the gym, had her do 75 pounds right off the bat.  Felt it in the shoulder.  Then she had her do some exercises propped up and off.  Has been bothering her for about 3 weeks.  Sore some all over.   Has a mammogram 9/29 -  3 years ago and had a mammo then.  Was on the side of the nipple.  It is still there.  Was put on keflex.  ? Pimmple.  Nice lady was doing some rehab, pelvic rehab, and at that point she was lifting some weight.  She is now having some pain in her anterior chest wall as well as underneath her arm in the region of the serratus anterior.  She also notes that she has some tenderness and altered texture in her right upper breast.  Immunization History  Administered Date(s) Administered  . Influenza,inj,Quad PF,6+ Mos 10/28/2018  . Influenza-Unspecified 11/17/2015  . Pneumococcal Polysaccharide-23 06/13/2009  . Td 06/25/2010     Past Medical History, Surgical History, Social History, Family History, Problem List, Medications, and Allergies have been reviewed and updated if relevant.  Patient Active Problem List   Diagnosis Date Noted  . Elevated blood pressure reading 04/15/2018  . Vitamin D deficiency 12/18/2017  . BCC (basal cell carcinoma) 09/07/2017  . GERD (gastroesophageal reflux disease) 01/12/2017  . HLD (hyperlipidemia) 01/12/2017  . Hyperthyroidism 02/28/2016   . Adjustment disorder with mixed anxiety and depressed mood 02/28/2016  . Migraine 07/25/2014  . Allergic rhinitis 07/25/2014  . Arthritis 07/25/2014    Past Medical History:  Diagnosis Date  . Allergy   . Arthritis   . GERD (gastroesophageal reflux disease)   . History of migraine headaches   . Hyperlipidemia   . Hyperthyroidism   . Tobacco abuse     Past Surgical History:  Procedure Laterality Date  . KNEE SURGERY Left    laproscopic  . TONSILECTOMY, ADENOIDECTOMY, BILATERAL MYRINGOTOMY AND TUBES    . TUBAL LIGATION      Social History   Socioeconomic History  . Marital status: Married    Spouse name: Not on file  . Number of children: Not on file  . Years of education: Not on file  . Highest education level: Not on file  Occupational History  . Not on file  Social Needs  . Financial resource strain: Not on file  . Food insecurity    Worry: Not on file    Inability: Not on file  . Transportation needs    Medical: Not on file    Non-medical: Not on file  Tobacco Use  . Smoking status: Former Smoker    Quit date: 08/18/2011    Years since quitting: 7.2  . Smokeless tobacco: Never Used  Substance and Sexual Activity  . Alcohol use: Yes  .  Drug use: No  . Sexual activity: Yes  Lifestyle  . Physical activity    Days per week: Not on file    Minutes per session: Not on file  . Stress: Not on file  Relationships  . Social Herbalist on phone: Not on file    Gets together: Not on file    Attends religious service: Not on file    Active member of club or organization: Not on file    Attends meetings of clubs or organizations: Not on file    Relationship status: Not on file  . Intimate partner violence    Fear of current or ex partner: Not on file    Emotionally abused: Not on file    Physically abused: Not on file    Forced sexual activity: Not on file  Other Topics Concern  . Not on file  Social History Narrative  . Not on file    Family  History  Adopted: Yes    Allergies  Allergen Reactions  . Atenolol     Made her "emotional and cry"  . Codeine     Medication list reviewed and updated in full in [place].  GEN: No fevers, chills. Nontoxic. Primarily MSK c/o today. MSK: Detailed in the HPI GI: tolerating PO intake without difficulty Neuro: No numbness, parasthesias, or tingling associated. Otherwise the pertinent positives of the ROS are noted above.   Objective:   BP 140/72   Pulse 98   Temp 98.8 F (37.1 C) (Temporal)   Ht 5\' 2"  (1.575 m)   Wt 138 lb 8 oz (62.8 kg)   SpO2 97%   BMI 25.33 kg/m    GEN: WDWN, NAD, Non-toxic, Alert & Oriented x 3 HEENT: Atraumatic, Normocephalic.  Ears and Nose: No external deformity. EXTR: No clubbing/cyanosis/edema NEURO: Normal gait.  PSYCH: Normally interactive. Conversant. Not depressed or anxious appearing.  Calm demeanor.    BREAST: On the right breast there is some tenderness in the right upper quadrant.  The texture appears to be different as well.  I cannot feel any lymphadenopathy in the axilla.  This is approximately in the 10 o'clock position.  Radiology: No results found.  Assessment and Plan:     ICD-10-CM   1. Breast mass in female  N63.0 MM DIAG BREAST TOMO BILATERAL    US BREAST LTD UNI RIGHT INC AXILLA  2. Need for influenza vaccination  Z23 Flu Vaccine QUAD 6+ mos PF IM (Fluarix Quad PF)  3. Breast tenderness in female  N64.4 MM DIAG BREAST TOMO BILATERAL    US BREAST LTD UNI RIGHT INC AXILLA   In the right upper quadrant approximately 10:00, patient does have a tenderness to deep palpation, and there is a question of possible mass or altered texture in this region as well.  Obtain a diagnostic mammogram as well as an right-sided breast ultrasound.  Follow-up: No follow-ups on file.  No orders of the defined types were placed in this encounter.  Orders Placed This Encounter  Procedures  . MM DIAG BREAST TOMO BILATERAL  . US  BREAST LTD UNI RIGHT INC AXILLA  . Flu Vaccine QUAD 6+ mos PF IM (Fluarix Quad PF)    Signed,  Arieal Cuoco T. Dustin Bumbaugh, MD   Outpatient Encounter Medications as of 10/28/2018  Medication Sig  . albuterol (PROVENTIL HFA;VENTOLIN HFA) 108 (90 Base) MCG/ACT inhaler   . Azelastine HCl 0.15 % SOLN   . methimazole (TAPAZOLE) 10 MG tablet  Take 10 mg by mouth daily.  . mometasone (NASONEX) 50 MCG/ACT nasal spray Place 2 sprays into the nose daily as needed.  . ondansetron (ZOFRAN-ODT) 8 MG disintegrating tablet Take 1 tablet (8 mg total) by mouth every 8 (eight) hours as needed for nausea.  . Vitamin D, Ergocalciferol, (DRISDOL) 1.25 MG (50000 UT) CAPS capsule Take by mouth.  . valACYclovir (VALTREX) 1000 MG tablet Take 1,000 mg by mouth daily.  . [DISCONTINUED] loteprednol (LOTEMAX) 0.2 % SUSP Apply 1 drop to eye 4 (four) times daily.  . [DISCONTINUED] methimazole (TAPAZOLE) 5 MG tablet 1/2 tab by mouth every other day (Patient taking differently: 5 mg. 1 tablet on odd days and 2 tablets on even days.)   No facility-administered encounter medications on file as of 10/28/2018.

## 2018-11-01 ENCOUNTER — Ambulatory Visit: Payer: BC Managed Care – PPO | Admitting: Physical Therapy

## 2018-11-05 ENCOUNTER — Other Ambulatory Visit: Payer: Self-pay

## 2018-11-05 ENCOUNTER — Ambulatory Visit
Admission: RE | Admit: 2018-11-05 | Discharge: 2018-11-05 | Disposition: A | Discharge status: Home / Self Care | Payer: BC Managed Care – PPO | Source: Ambulatory Visit | Attending: Family Medicine | Admitting: Family Medicine

## 2018-11-05 DIAGNOSIS — N644 Mastodynia: Secondary | ICD-10-CM

## 2018-11-05 DIAGNOSIS — N63 Unspecified lump in unspecified breast: Secondary | ICD-10-CM

## 2018-11-08 ENCOUNTER — Ambulatory Visit: Payer: BC Managed Care – PPO | Admitting: Physical Therapy

## 2018-11-15 ENCOUNTER — Encounter: Payer: BC Managed Care – PPO | Admitting: Physical Therapy

## 2018-11-16 ENCOUNTER — Ambulatory Visit: Payer: BC Managed Care – PPO

## 2018-11-22 ENCOUNTER — Encounter: Payer: BC Managed Care – PPO | Admitting: Physical Therapy

## 2019-04-29 ENCOUNTER — Telehealth: Payer: Self-pay

## 2019-04-29 NOTE — Telephone Encounter (Signed)
Referral closed.  See Referral Notes/hx.  Pt has not responded to phone calls or Unable to Contact letter.  

## 2019-04-29 NOTE — Telephone Encounter (Signed)
Noted, thanks for the update 

## 2019-06-17 ENCOUNTER — Ambulatory Visit: Payer: BC Managed Care – PPO | Attending: Internal Medicine

## 2019-06-17 DIAGNOSIS — Z23 Encounter for immunization: Secondary | ICD-10-CM

## 2019-06-17 NOTE — Progress Notes (Signed)
   Covid-19 Vaccination Clinic  Name:  Lydia Carter    MRN: [id number] DOB: 06/13/1955  06/17/2019  Ms. Hacker was observed post Covid-19 immunization for 15 minutes without incident. She was provided with Vaccine Information Sheet and instruction to access the V-Safe system.   Ms. Kollmann was instructed to call 911 with any severe reactions post vaccine: Marland Kitchen Difficulty breathing  . Swelling of face and throat  . A fast heartbeat  . A bad rash all over body  . Dizziness and weakness   Immunizations Administered    Name Date Dose VIS Date Route   Pfizer COVID-19 Vaccine 06/17/2019 10:44 AM 0.3 mL 04/13/2018 Intramuscular   Manufacturer: [place]   Lot: [id number]   [place]: [id number]

## 2019-07-12 ENCOUNTER — Ambulatory Visit: Payer: BC Managed Care – PPO | Attending: Internal Medicine

## 2019-07-12 DIAGNOSIS — Z23 Encounter for immunization: Secondary | ICD-10-CM

## 2019-07-12 NOTE — Progress Notes (Signed)
   Covid-19 Vaccination Clinic  Name:  Lydia Carter    MRN: [id number] DOB: 1955-02-23  07/12/2019  [name] was observed post Covid-19 immunization for 15 minutes without incident. She was provided with Vaccine Information Sheet and instruction to access the V-Safe system.   [name] was instructed to call 911 with any severe reactions post vaccine: Marland Kitchen Difficulty breathing  . Swelling of face and throat  . A fast heartbeat  . A bad rash all over body  . Dizziness and weakness   Immunizations Administered    Name Date Dose VIS Date Route   Pfizer COVID-19 Vaccine 07/12/2019  2:06 PM 0.3 mL 04/13/2018 Intramuscular   Manufacturer: [employer]   Lot: [id number]   [place]: [id number]

## 2019-11-27 ENCOUNTER — Other Ambulatory Visit: Payer: Self-pay

## 2019-11-27 ENCOUNTER — Ambulatory Visit
Admission: EM | Admit: 2019-11-27 | Discharge: 2019-11-27 | Disposition: A | Discharge status: Home / Self Care | Payer: BC Managed Care – PPO | Attending: Emergency Medicine | Admitting: Emergency Medicine

## 2019-11-27 DIAGNOSIS — H10023 Other mucopurulent conjunctivitis, bilateral: Secondary | ICD-10-CM | POA: Diagnosis not present

## 2019-11-27 DIAGNOSIS — J321 Chronic frontal sinusitis: Secondary | ICD-10-CM | POA: Diagnosis not present

## 2019-11-27 MED ORDER — TOBRAMYCIN-DEXAMETHASONE 0.3-0.1 % OP SUSP: 2.0000 [drp] | Freq: Four times a day (QID) | OPHTHALMIC | 0 refills | Status: DC

## 2019-11-27 MED ORDER — AMOXICILLIN-POT CLAVULANATE 875-125 MG PO TABS: 1.0000 | ORAL_TABLET | Freq: Two times a day (BID) | ORAL | 0 refills | Status: AC

## 2019-11-27 NOTE — ED Provider Notes (Signed)
[facility]    CSN: [id number] Arrival date & time: 11/27/19  0272      History   Chief Complaint Chief Complaint  Patient presents with  . Facial Pain    HPI Lydia Carter is a 64 y.o. female.   64 yo female here for evaluation of sinus pressure/pain, and nasal discharge x 10 days. The discharge has changed from clear to a green yellow. Her pain is mostly in her left frontal sinus and this is typical for her. She denies pain in her teeth, ear pressure, cough, SOB, or wheezing. She did develop eye redness yesterday but no itching. This morning she woke up with both eyes matted shut, green sticky discharge on her lashes, and some blurriness to her vision and gritty feeling in both eyes. She has not had a fever or ST.      Past Medical History:  Diagnosis Date  . Allergy   . Arthritis   . GERD (gastroesophageal reflux disease)   . History of migraine headaches   . Hyperlipidemia   . Hyperthyroidism   . Tobacco abuse     Patient Active Problem List   Diagnosis Date Noted  . Elevated blood pressure reading 04/15/2018  . Vitamin D deficiency 12/18/2017  . BCC (basal cell carcinoma) 09/07/2017  . GERD (gastroesophageal reflux disease) 01/12/2017  . HLD (hyperlipidemia) 01/12/2017  . Hyperthyroidism 02/28/2016  . Adjustment disorder with mixed anxiety and depressed mood 02/28/2016  . Migraine 07/25/2014  . Allergic rhinitis 07/25/2014  . Arthritis 07/25/2014    Past Surgical History:  Procedure Laterality Date  . KNEE SURGERY Left    laproscopic  . TONSILECTOMY, ADENOIDECTOMY, BILATERAL MYRINGOTOMY AND TUBES    . TUBAL LIGATION      OB History    Gravida  1   Para  1   Term      Preterm      AB      Living        SAB      TAB      Ectopic      Multiple      Live Births               Home Medications    Prior to Admission medications   Medication Sig Start Date End Date Taking? Authorizing Provider  Azelastine HCl 0.15  % SOLN  09/09/18  Yes [provider]  methimazole (TAPAZOLE) 10 MG tablet Take 10 mg by mouth daily.   Yes [provider]  mometasone (NASONEX) 50 MCG/ACT nasal spray Place 2 sprays into the nose daily as needed.   Yes [provider]  ondansetron (ZOFRAN-ODT) 8 MG disintegrating tablet Take 1 tablet (8 mg total) by mouth every 8 (eight) hours as needed for nausea. 08/24/18  Yes Lesleigh Noe, MD  valACYclovir (VALTREX) 1000 MG tablet Take 1,000 mg by mouth daily.   Yes [provider]  Vitamin D, Ergocalciferol, (DRISDOL) 1.25 MG (50000 UT) CAPS capsule Take by mouth. 12/20/17  Yes [provider]  amoxicillin-clavulanate (AUGMENTIN) 875-125 MG tablet Take 1 tablet by mouth every 12 (twelve) hours for 10 days. 11/27/19 12/07/19  Margarette Canada, NP  tobramycin-dexamethasone [facility]) ophthalmic solution Place 2 drops into both eyes every 6 (six) hours. 11/27/19   Margarette Canada, NP  albuterol (PROVENTIL HFA;VENTOLIN HFA) 108 [phone] Base) MCG/ACT inhaler  03/11/18 11/27/19  [provider]    Family History Family History  Adopted: Yes  Social History Social History   Tobacco Use  . Smoking status: Former Smoker    Quit date: 08/18/2011    Years since quitting: 8.2  . Smokeless tobacco: Never Used  Vaping Use  . Vaping Use: Never used  Substance Use Topics  . Alcohol use: Yes  . Drug use: No     Allergies   Atenolol and Codeine   Review of Systems Review of Systems  Constitutional: Negative for activity change, appetite change and fever.  HENT: Positive for congestion, rhinorrhea, sinus pressure and sinus pain. Negative for dental problem, ear pain, sore throat and tinnitus.        Yellow-green nasal discharge, frontal sinus pain.  Eyes: Positive for discharge, redness and visual disturbance. Negative for photophobia, pain and itching.       Green eye discharge and redness. Vision is blurry.   Respiratory: Negative for cough,  shortness of breath and wheezing.   Cardiovascular: Negative for chest pain.  Gastrointestinal: Negative for diarrhea, nausea and vomiting.  Genitourinary: Negative for dysuria and frequency.  Musculoskeletal: Negative for arthralgias and myalgias.  Neurological: Negative for syncope and headaches.  Hematological: Negative.   Psychiatric/Behavioral: Negative.      Physical Exam Triage Vital Signs ED Triage Vitals  Enc Vitals Group     BP 11/27/19 1013 140/69     Pulse Rate 11/27/19 1013 94     Resp 11/27/19 1013 14     Temp 11/27/19 1013 98.1 F (36.7 C)     Temp Source 11/27/19 1013 Oral     SpO2 11/27/19 1013 99 %     Weight 11/27/19 1010 138 lb (62.6 kg)     Height 11/27/19 1010 5\' 2"  (1.575 m)     Head Circumference --      Peak Flow --      Pain Score 11/27/19 1010 4     Pain Loc --      Pain Edu? --      Excl. in [place]? --    No data found.  Updated Vital Signs BP 140/69 (BP Location: Right Arm)   Pulse 94   Temp 98.1 F (36.7 C) (Oral)   Resp 14   Ht 5\' 2"  (1.575 m)   Wt 138 lb (62.6 kg)   SpO2 99%   BMI 25.24 kg/m   Visual Acuity Right Eye Distance: 20/25 Left Eye Distance: 20/30 Bilateral Distance: 20/30 (corrected)  Right Eye Near:   Left Eye Near:    Bilateral Near:     Physical Exam Vitals and nursing note reviewed.  Constitutional:      General: She is not in acute distress.    Appearance: Normal appearance.  HENT:     Head: Normocephalic and atraumatic.     Right Ear: Tympanic membrane, ear canal and external ear normal.     Left Ear: Tympanic membrane, ear canal and external ear normal.     Nose: Congestion and rhinorrhea present.     Comments: Nasal mucosa is edematous and erythematous with some dark discharge on the floor of her turbinate on the right. Left frontal and maxillary sinuses are tender to percussion.     Mouth/Throat:     Mouth: Mucous membranes are moist.     Pharynx: Oropharynx is clear. No oropharyngeal exudate.  Eyes:       General: No scleral icterus.       Right eye: Discharge present.        Left eye: Discharge present.  Extraocular Movements: Extraocular movements intact.     Conjunctiva/sclera:     Right eye: Right conjunctiva is not injected. No exudate.    Left eye: Left conjunctiva is not injected. No exudate.    Pupils: Pupils are equal, round, and reactive to light.     Comments: Bulbar and labral conjunctive are erythematous and injected.   Woods lamp and fluorescein stain are negative for corneal defect or foreign body.   Cardiovascular:     Rate and Rhythm: Normal rate and regular rhythm.     Pulses: Normal pulses.     Heart sounds: Normal heart sounds. No murmur heard.  No gallop.   Pulmonary:     Effort: Pulmonary effort is normal.     Breath sounds: Normal breath sounds. No wheezing, rhonchi or rales.  Musculoskeletal:        General: No swelling or tenderness. Normal range of motion.     Cervical back: Normal range of motion and neck supple.  Lymphadenopathy:     Cervical: No cervical adenopathy.  Skin:    General: Skin is warm and dry.     Capillary Refill: Capillary refill takes less than 2 seconds.     Findings: No erythema.  Neurological:     General: No focal deficit present.     Mental Status: She is alert and oriented to person, place, and time.     Gait: Gait normal.  Psychiatric:        Mood and Affect: Mood normal.        Behavior: Behavior normal.        Thought Content: Thought content normal.        Judgment: Judgment normal.      UC Treatments / Results  Labs (all labs ordered are listed, but only abnormal results are displayed) Labs Reviewed - No data to display  EKG   Radiology No results found.  Procedures Procedures (including critical care time)  Medications Ordered in UC Medications - No data to display  Initial Impression / Assessment and Plan / UC Course  I have reviewed the triage vital signs and the nursing notes.  Pertinent  labs & imaging results that were available during my care of the patient were reviewed by me and considered in my medical decision making (see chart for details).   Patient presents with 10 days of sinus pressure and pain and a change in nasal discharge. She has not had a fever. Nasal mucosa is red and swollen. There is dark discharge on the floor of the right turbinate.   Will treat for sinusitis with Augmentin x 10 days, continue nasal steroid, nasal irrigation, and allegra.  She also has bilateral eye redness and blurred vision. She describes it as a film on her eyes. Visual acuity OD 20/25, OS 20/30, OU 20/30. She is due for her annual eye exam. Eyes are red and injected bilaterally and there is green discharge in the inner canthus of the left eye. Woods lamp exam negative for corneal defect or abrasion.  Will treat for conjunctivitis with Tobradex.  D/C home to F/U with PCP and eye doctor.    Final Clinical Impressions(s) / UC Diagnoses   Final diagnoses:  Chronic frontal sinusitis  Other mucopurulent conjunctivitis of both eyes     Discharge Instructions     Continue your nasal steroids and Allegra.  Perform your nasal irrigation with distilled water 2-3 times a day.  Take the Augmentin 2x a day for 10 days with  food. Take a ProBiotic 1 hour after each dose of antibiotics to avoid diarrhea or a yeast infection.   Take Tobradex 2 drops in both eyes every 6 hours for 5-7 days.  Follow-up with your PCP and eye doctor if not improving.     ED Prescriptions    Medication Sig Dispense Auth. Provider   amoxicillin-clavulanate (AUGMENTIN) 875-125 MG tablet Take 1 tablet by mouth every 12 (twelve) hours for 10 days. 20 tablet Margarette Canada, NP   tobramycin-dexamethasone [facility]) ophthalmic solution Place 2 drops into both eyes every 6 (six) hours. 5 mL Margarette Canada, NP     PDMP not reviewed this encounter.   Margarette Canada, NP 11/27/19 1055

## 2019-11-27 NOTE — ED Triage Notes (Signed)
Patient complains of sinus pain and pressure, bilateral eye redness x 10 days. States that she has been taking her allergy medication without relief. Reports that the eye issues started this morning.

## 2019-11-27 NOTE — Discharge Instructions (Addendum)
Continue your nasal steroids and Allegra.  Perform your nasal irrigation with distilled water 2-3 times a day.  Take the Augmentin 2x a day for 10 days with food. Take a ProBiotic 1 hour after each dose of antibiotics to avoid diarrhea or a yeast infection.   Take Tobradex 2 drops in both eyes every 6 hours for 5-7 days.  Follow-up with your PCP and eye doctor if not improving.

## 2020-03-21 IMAGING — MG MM DIGITAL DIAGNOSTIC BILAT W/ TOMO W/ CAD
5 series · 6 of 21 positions shown · non-contrast
Comparison: Previous exam(s).

CLINICAL DATA: Patient presents with pain from the left axilla to
the upper outer aspect of the right breast. No reported lumps.
lifting.

EXAM:
DIGITAL DIAGNOSTIC BILATERAL MAMMOGRAM WITH CAD AND TOMO
ULTRASOUND RIGHT BREAST

[L MLO synth-2D]
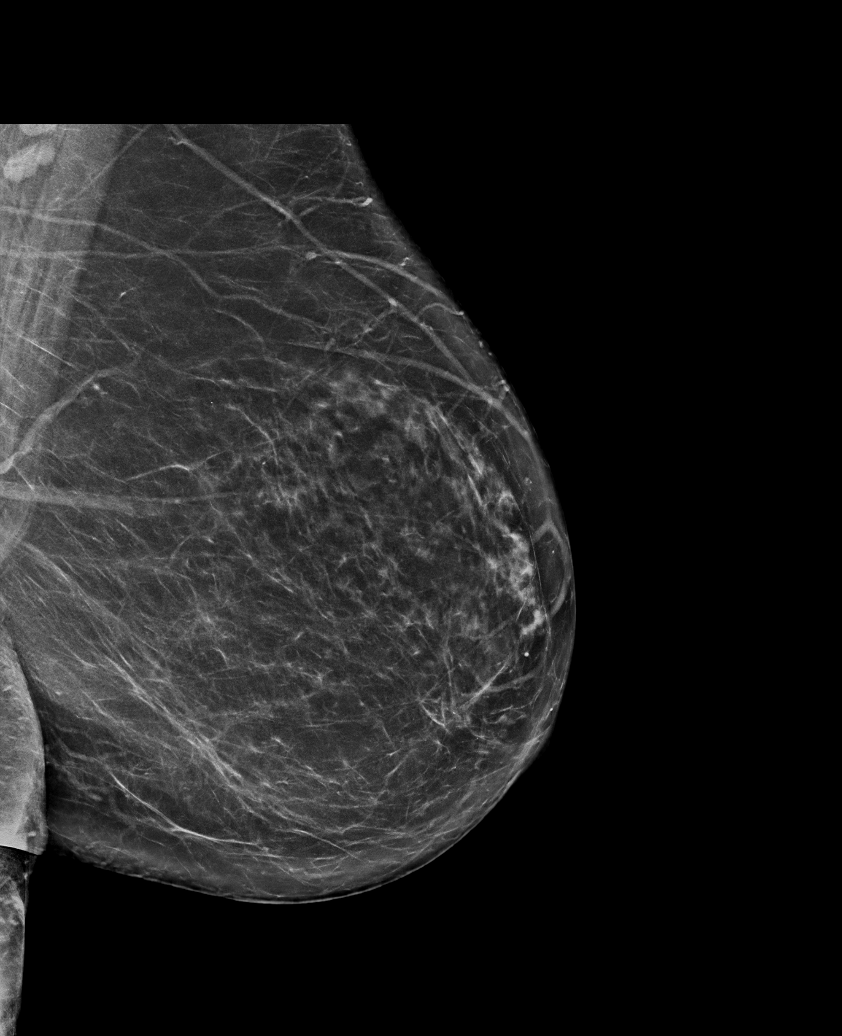

[L CC tomo · 2 of 72 frames shown]
[frame 24/72]
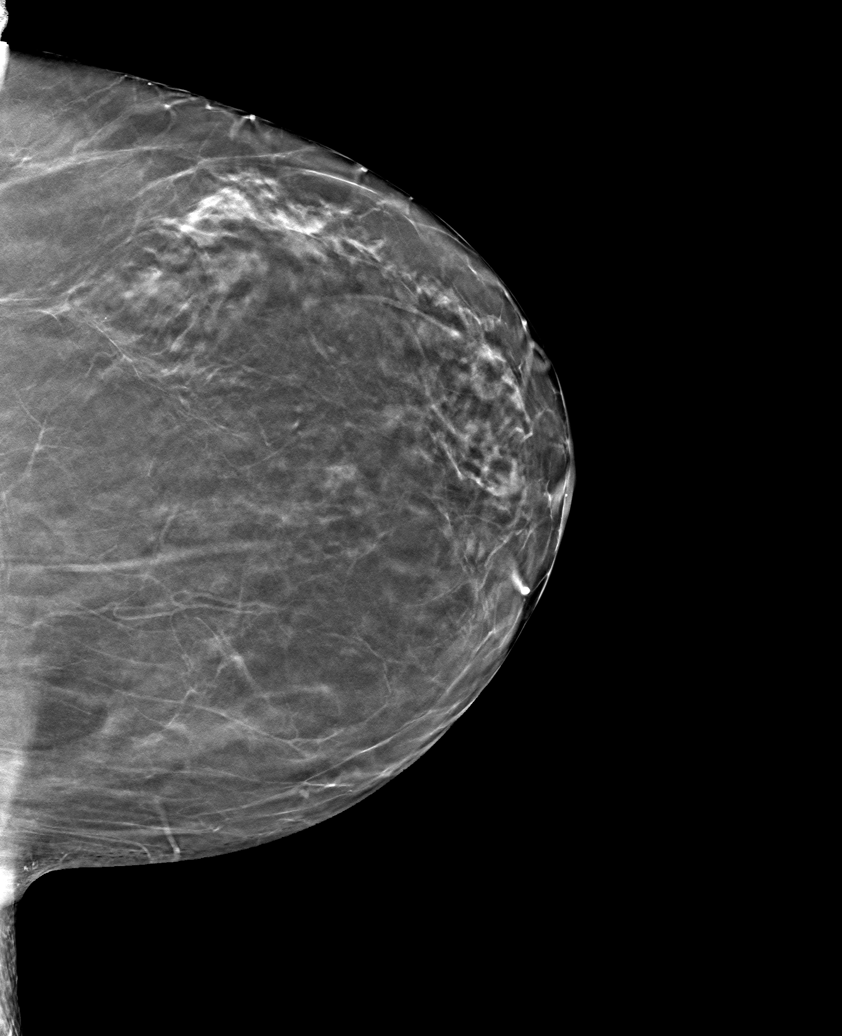
[frame 37/72]
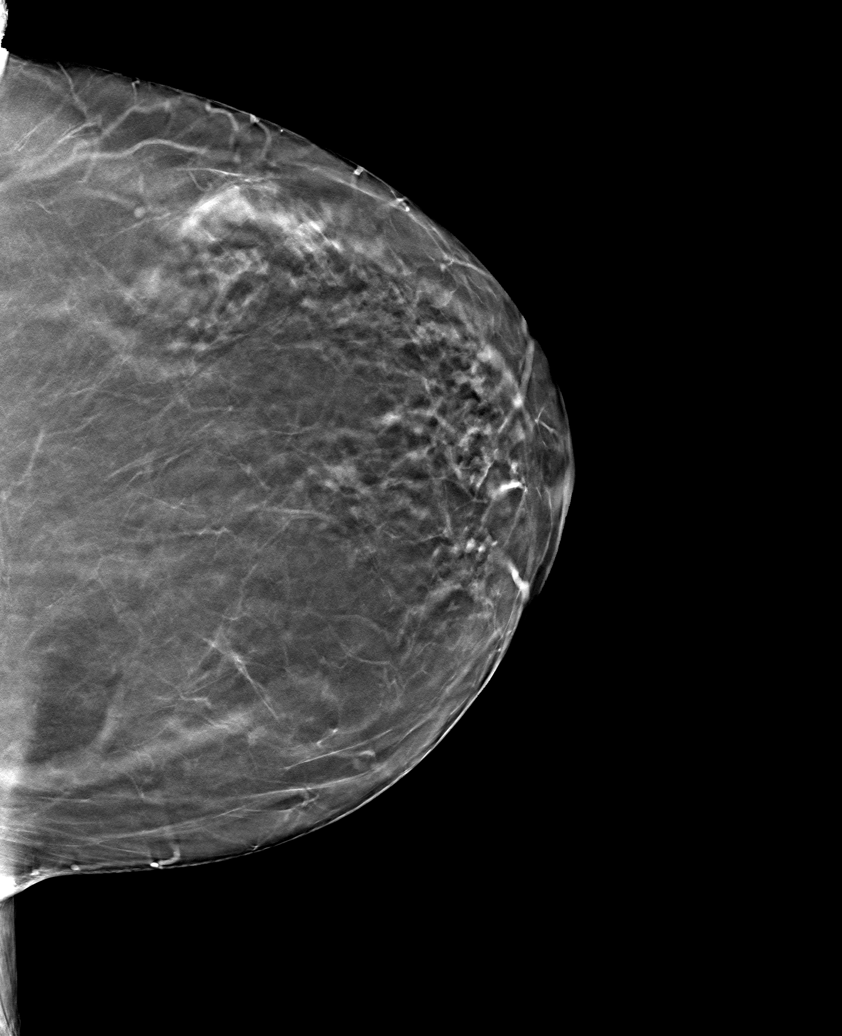

[R CC tomo · tomo slice 33/66.0]
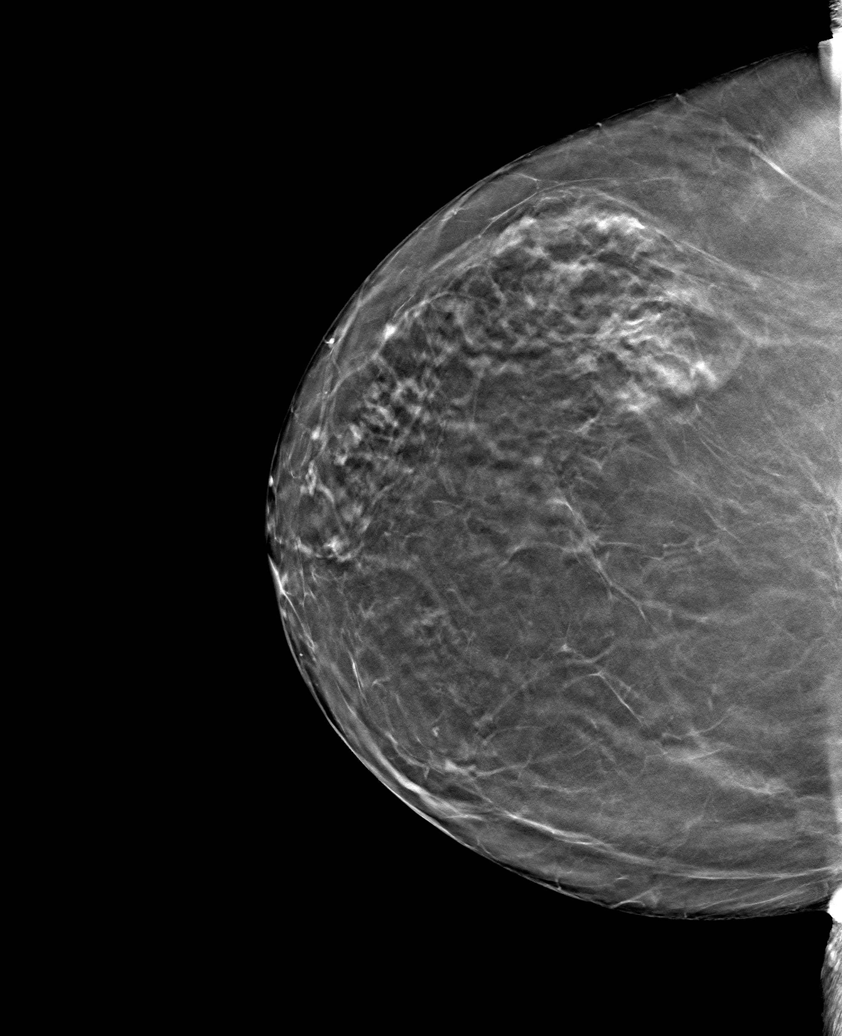

[L MLO tomo · tomo slice 39/76.0]
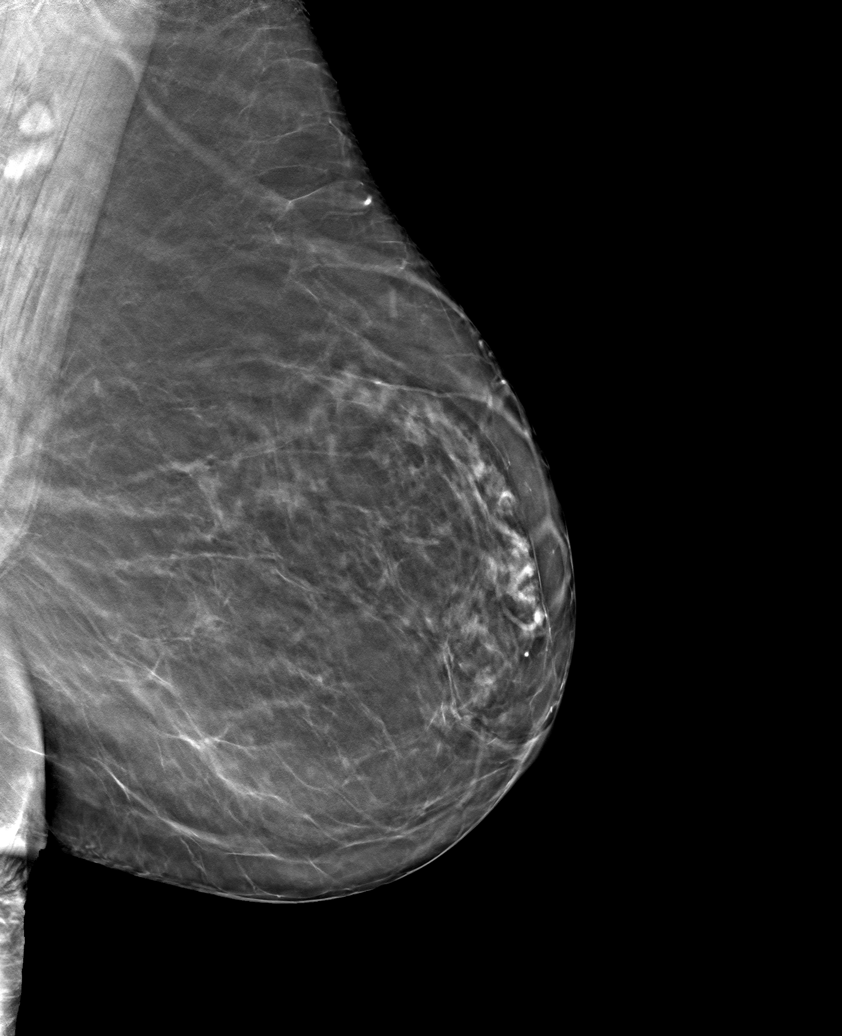

[R MLO tomo · tomo slice 38/75.0]
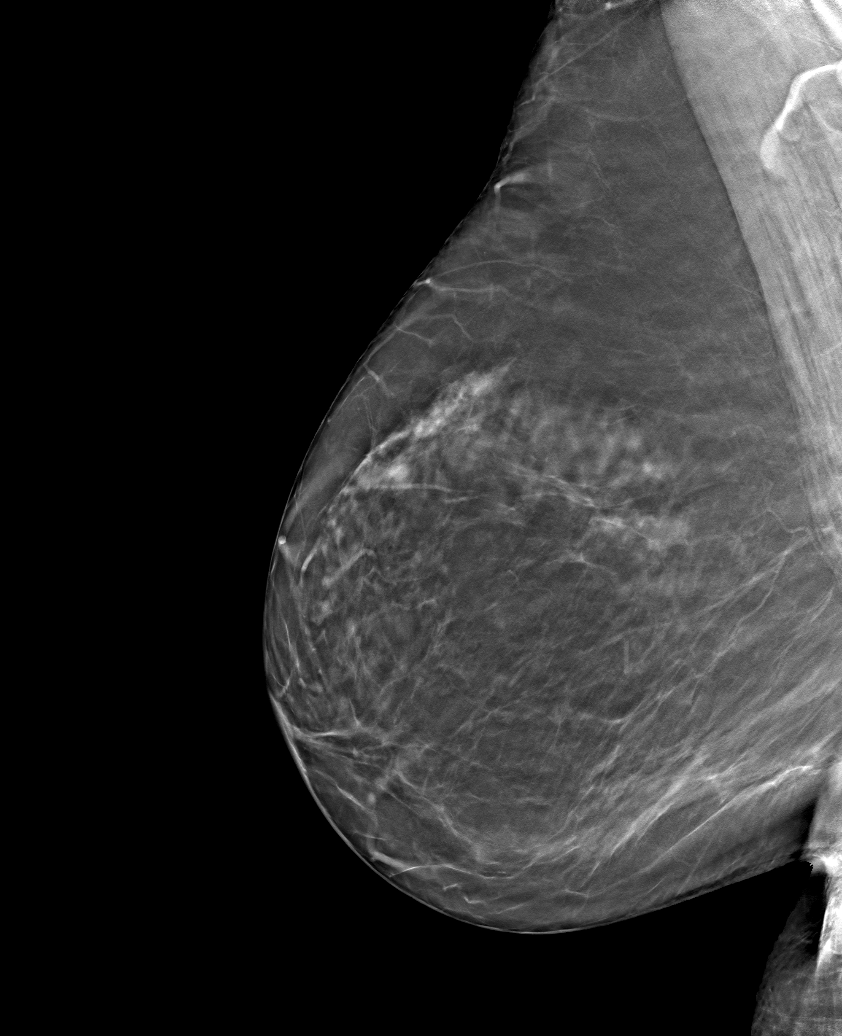

[6 of 21 positions shown; findings below may reference images not displayed]

ACR Breast Density Category b: There are scattered areas of
fibroglandular density.
FINDINGS: There are no masses, areas of architectural distortion, areas of
significant asymmetry or suspicious calcifications. No mammographic
change.

Mammographic images were processed with CAD.

Targeted ultrasound is performed, showing normal tissue throughout
the upper outer right breast as well as the right axilla. No
enlarged or abnormal lymph nodes. No masses.
IMPRESSION: Negative exam.  No evidence of breast malignancy.

RECOMMENDATION:
Screening mammogram in one year.(Code:[id number])

I have discussed the findings and recommendations with the patient.
If applicable, a reminder letter will be sent to the patient
regarding the next appointment.

BI-RADS CATEGORY  1: Negative.

## 2020-08-13 ENCOUNTER — Other Ambulatory Visit: Payer: Self-pay

## 2020-08-13 ENCOUNTER — Ambulatory Visit
Admission: RE | Admit: 2020-08-13 | Discharge: 2020-08-13 | Disposition: A | Discharge status: Home / Self Care | Payer: BC Managed Care – PPO | Source: Ambulatory Visit | Attending: Emergency Medicine | Admitting: Emergency Medicine

## 2020-08-13 VITALS — BP 154/87 | HR 103 | Temp 98.7°F | Resp 16

## 2020-08-13 DIAGNOSIS — J069 Acute upper respiratory infection, unspecified: Secondary | ICD-10-CM | POA: Diagnosis not present

## 2020-08-13 DIAGNOSIS — K625 Hemorrhage of anus and rectum: Secondary | ICD-10-CM | POA: Diagnosis not present

## 2020-08-13 MED ORDER — IPRATROPIUM BROMIDE 0.06 % NA SOLN: 2.0000 | Freq: Four times a day (QID) | NASAL | 12 refills | Status: DC

## 2020-08-13 MED ORDER — HYDROCORTISONE ACETATE 25 MG RE SUPP: 25.0000 mg | Freq: Two times a day (BID) | RECTAL | 0 refills | Status: DC

## 2020-08-13 NOTE — ED Provider Notes (Signed)
[facility]    CSN: [id number] Arrival date & time: 08/13/20  1151      History   Chief Complaint Chief Complaint  Patient presents with   Appointment    1300   Abdominal Pain    HPI Lydia Carter is a 65 y.o. female.   HPI  65 year old female here for 2 complaints.  Patient's first complaint is right ear pain that she has had for the last 4 days.  She states that it mainly hurts with swallowing.  There is no muffled hearing or ringing in her ear.  Patient second complaint is Blight red blood and mucus per rectum for the last 3 days.  She reports that she had approximately 5 stools like this daily that were mostly mucus.  She reports that today things are back to normal and she has not seen any blood but she is still having mucus.  She also reports an elevated temp of 99 for the past 3 to 4 days.  Patient is a history of IBS but has not had a colonoscopy in over 10 years.  She denies nausea vomiting, abdominal pain, rectal itching, dizziness, or fainting.  Past Medical History:  Diagnosis Date   Allergy    Arthritis    GERD (gastroesophageal reflux disease)    History of migraine headaches    Hyperlipidemia    Hyperthyroidism    Tobacco abuse     Patient Active Problem List   Diagnosis Date Noted   Elevated blood pressure reading 04/15/2018   Vitamin D deficiency 12/18/2017   BCC (basal cell carcinoma) 09/07/2017   GERD (gastroesophageal reflux disease) 01/12/2017   HLD (hyperlipidemia) 01/12/2017   Hyperthyroidism 02/28/2016   Adjustment disorder with mixed anxiety and depressed mood 02/28/2016   Migraine 07/25/2014   Allergic rhinitis 07/25/2014   Arthritis 07/25/2014    Past Surgical History:  Procedure Laterality Date   KNEE SURGERY Left    laproscopic   TONSILECTOMY, ADENOIDECTOMY, BILATERAL MYRINGOTOMY AND TUBES     TUBAL LIGATION      OB History     Gravida  1   Para  1   Term      Preterm      AB      Living          SAB      IAB      Ectopic      Multiple      Live Births               Home Medications    Prior to Admission medications   Medication Sig Start Date End Date Taking? Authorizing Provider  hydrocortisone (ANUSOL-HC) 25 MG suppository Place 1 suppository (25 mg total) rectally 2 (two) times daily. 08/13/20  Yes Margarette Canada, NP  ipratropium (ATROVENT) 0.06 % nasal spray Place 2 sprays into both nostrils 4 (four) times daily. 08/13/20  Yes Margarette Canada, NP  Azelastine HCl 0.15 % SOLN  09/09/18   [provider]  methimazole (TAPAZOLE) 10 MG tablet Take 10 mg by mouth daily.    [provider]  mometasone (NASONEX) 50 MCG/ACT nasal spray Place 2 sprays into the nose daily as needed.    [provider]  ondansetron (ZOFRAN-ODT) 8 MG disintegrating tablet Take 1 tablet (8 mg total) by mouth every 8 (eight) hours as needed for nausea. 08/24/18   Lesleigh Noe, MD  tobramycin-dexamethasone [facility]) ophthalmic solution Place 2 drops into both eyes every 6 (  six) hours. 11/27/19   Margarette Canada, NP  valACYclovir (VALTREX) 1000 MG tablet Take 1,000 mg by mouth daily.    [provider]  Vitamin D, Ergocalciferol, (DRISDOL) 1.25 MG (50000 UT) CAPS capsule Take by mouth. 12/20/17   [provider]  albuterol (PROVENTIL HFA;VENTOLIN HFA) 108 (90 Base) MCG/ACT inhaler  03/11/18 11/27/19  [provider]    Family History Family History  Adopted: Yes    Social History Social History   Tobacco Use   Smoking status: Former    Pack years: 0.00    Types: Cigarettes    Quit date: 08/18/2011    Years since quitting: 8.9   Smokeless tobacco: Never  Vaping Use   Vaping Use: Never used  Substance Use Topics   Alcohol use: Yes   Drug use: No     Allergies   Atenolol and Codeine   Review of Systems Review of Systems  Constitutional:  Positive for fever.  HENT:  Positive for postnasal drip and sore throat. Negative for rhinorrhea.    Gastrointestinal:  Positive for anal bleeding and blood in stool. Negative for abdominal pain, diarrhea, nausea, rectal pain and vomiting.  Neurological:  Negative for dizziness and syncope.  Hematological: Negative.   Psychiatric/Behavioral: Negative.      Physical Exam Triage Vital Signs ED Triage Vitals  Enc Vitals Group     BP 08/13/20 1313 (!) 154/87     Pulse Rate 08/13/20 1313 (!) 103     Resp 08/13/20 1313 16     Temp 08/13/20 1313 98.7 F (37.1 C)     Temp Source 08/13/20 1313 Oral     SpO2 08/13/20 1313 98 %     Weight --      Height --      Head Circumference --      Peak Flow --      Pain Score 08/13/20 1320 0     Pain Loc --      Pain Edu? --      Excl. in [place]? --    No data found.  Updated Vital Signs BP (!) 154/87 (BP Location: Right Arm)   Pulse (!) 103   Temp 98.7 F (37.1 C) (Oral)   Resp 16   SpO2 98%   Visual Acuity Right Eye Distance:   Left Eye Distance:   Bilateral Distance:    Right Eye Near:   Left Eye Near:    Bilateral Near:     Physical Exam Vitals and nursing note reviewed. Exam conducted with a chaperone present Alanson Aly, RN).  Constitutional:      General: She is not in acute distress.    Appearance: Normal appearance. She is well-developed and normal weight.  HENT:     Head: Normocephalic and atraumatic.     Right Ear: Tympanic membrane, ear canal and external ear normal. There is no impacted cerumen.     Left Ear: Tympanic membrane, ear canal and external ear normal. There is no impacted cerumen.     Nose: Congestion present. No rhinorrhea.     Mouth/Throat:     Mouth: Mucous membranes are moist.     Pharynx: Oropharynx is clear. Posterior oropharyngeal erythema present.  Cardiovascular:     Rate and Rhythm: Normal rate and regular rhythm.     Pulses: Normal pulses.     Heart sounds: Normal heart sounds. No murmur heard.   No gallop.  Pulmonary:     Effort: Pulmonary effort is normal.  Breath sounds: Normal breath  sounds. No wheezing, rhonchi or rales.  Abdominal:     General: Abdomen is flat. Bowel sounds are normal.     Palpations: Abdomen is soft.     Tenderness: There is no abdominal tenderness. There is no guarding or rebound.  Genitourinary:    Rectum: Normal.  Musculoskeletal:     Cervical back: Normal range of motion and neck supple.  Lymphadenopathy:     Cervical: No cervical adenopathy.  Skin:    General: Skin is warm and dry.     Capillary Refill: Capillary refill takes less than 2 seconds.     Findings: No erythema or rash.  Neurological:     General: No focal deficit present.     Mental Status: She is alert and oriented to person, place, and time.  Psychiatric:        Mood and Affect: Mood normal.        Behavior: Behavior normal.        Thought Content: Thought content normal.        Judgment: Judgment normal.     UC Treatments / Results  Labs (all labs ordered are listed, but only abnormal results are displayed) Labs Reviewed - No data to display  EKG   Radiology No results found.  Procedures Procedures (including critical care time)  Medications Ordered in UC Medications - No data to display  Initial Impression / Assessment and Plan / UC Course  I have reviewed the triage vital signs and the nursing notes.  Pertinent labs & imaging results that were available during my care of the patient were reviewed by me and considered in my medical decision making (see chart for details).  Patient is a very pleasant and nontoxic-appearing 65 year old female here for evaluation of upper respiratory and abdominal complaints as outlined in HPI above.  Patient's physical exam reveals pearly gray tympanic membranes bilaterally with a normal light reflex and clear external auditory canals.  She has no tenderness with palpation of the eustachian tube externally on either side.  Nasal mucosa is erythematous and edematous without nasal discharge.  Oropharyngeal exam reveals posterior  oropharyngeal erythema with mild cobblestoning and clear postnasal drip.  No cervical lymphadenopathy appreciable exam.  Cardiopulmonary exam is benign.  Abdomen is flat, soft, nontender, with positive bowel sounds in all 4 quadrants.  Rectal exam performed with direct visualization only while chaperone was in the room.  There is no blood in the rectal opening or around the rectum and there are no external hemorrhoids noted.  Given the lack of fever, abdominal pain, and resolved rectal bleeding I suspect that patient's source might be internal hemorrhoids.  She indicates that she did have internal hemorrhoids when she was pregnant.  She also indicates that she had been straining to have a bowel movement recently due to constipation.  Patient also has an upper respiratory infection.  We will treat patient with Atrovent nasal spray to help with nasal congestion and postnasal drip and hydrocortisone suppositories to help with decreasing inflammation of the presumptive internal hemorrhoids.  We will also make a referral to gastroenterology.  Patient advised to increase her water intake and fiber intake so that she does not develop constipation and to also use a stool softener such as Colace twice daily.   Final Clinical Impressions(s) / UC Diagnoses   Final diagnoses:  Acute upper respiratory infection  BRBPR (bright red blood per rectum)     Discharge Instructions  For your upper respiratory infection:  Use the ipratropium nasal spray, 2 squirts up each nostril 4 times a day, to help with nasal congestion and postnasal drip.  For the bright red blood per rectum and presumptive internal hemorrhoids.  Use hydrocortisone suppositories twice daily.  Please keep them in the fridge so that they maintain their form for use.  I have referred you to gastroenterology for evaluation and for colonoscopy as its been greater than 10 years since you have had 1 you have a history of irritable bowel  syndrome.     ED Prescriptions     Medication Sig Dispense Auth. Provider   ipratropium (ATROVENT) 0.06 % nasal spray Place 2 sprays into both nostrils 4 (four) times daily. 15 mL Margarette Canada, NP   hydrocortisone (ANUSOL-HC) 25 MG suppository Place 1 suppository (25 mg total) rectally 2 (two) times daily. 12 suppository Margarette Canada, NP      PDMP not reviewed this encounter.   Margarette Canada, NP 08/13/20 [phone]

## 2020-08-13 NOTE — Discharge Instructions (Addendum)
For your upper respiratory infection:  Use the ipratropium nasal spray, 2 squirts up each nostril 4 times a day, to help with nasal congestion and postnasal drip.  For the bright red blood per rectum and presumptive internal hemorrhoids.  Use hydrocortisone suppositories twice daily.  Please keep them in the fridge so that they maintain their form for use.  I have referred you to gastroenterology for evaluation and for colonoscopy as its been greater than 10 years since you have had 1 you have a history of irritable bowel syndrome.

## 2020-08-13 NOTE — ED Triage Notes (Signed)
Patient presents to Urgent Care with complaints of right ear pain since Friday. Pt also concerned with abdominal pain, diarrhea, and blood in stool noted for three days. She states no blood noted today. Has a hx of IBS.   Denies fever.

## 2020-10-15 ENCOUNTER — Ambulatory Visit: Payer: BC Managed Care – PPO | Admitting: Gastroenterology

## 2020-11-24 ENCOUNTER — Ambulatory Visit: Payer: Self-pay

## 2020-12-03 ENCOUNTER — Other Ambulatory Visit: Payer: Self-pay

## 2020-12-03 ENCOUNTER — Ambulatory Visit: Payer: BC Managed Care – PPO | Admitting: Gastroenterology

## 2020-12-03 ENCOUNTER — Encounter: Payer: Self-pay | Admitting: Gastroenterology

## 2020-12-03 VITALS — BP 131/75 | HR 90 | Temp 97.5°F | Ht 62.0 in | Wt 122.0 lb

## 2020-12-03 DIAGNOSIS — K625 Hemorrhage of anus and rectum: Secondary | ICD-10-CM

## 2020-12-03 DIAGNOSIS — R634 Abnormal weight loss: Secondary | ICD-10-CM

## 2020-12-03 DIAGNOSIS — K921 Melena: Secondary | ICD-10-CM

## 2020-12-03 MED ORDER — CLENPIQ 10-3.5-12 MG-GM -GM/160ML PO SOLN: 320.0000 mL | ORAL | 0 refills | Status: DC

## 2020-12-03 NOTE — H&P (View-Only) (Signed)
Gastroenterology Consultation  Referring Provider:     Margarette Canada, NP Primary Care Physician:  Lesleigh Noe, MD Primary Gastroenterologist:  Dr. Allen Norris     Reason for Consultation:     Rectal bleeding        HPI:   Lydia Carter is a 65 y.o. y/o female referred for consultation & management of Rectal bleeding by Dr. Einar Pheasant, Jobe Marker, MD.  This patient comes in today with a history of irritable bowel syndrome and she reports that she has been having some rectal bleeding.  The patient also reports that because of her thyroid disease she has lost approximately 60 pounds with 20 them being over the last year.  The patient states that her bright red blood from her rectum occurs usually after she is constipated. She reports that she has not had a colonoscopy in many years.  She states that she stopped drinking and smoking while being restricted to home during the code pandemic in taking care of her sick husband.  She denies any black stools or nausea or vomiting.   Past Medical History:  Diagnosis Date   Allergy    Arthritis    GERD (gastroesophageal reflux disease)    History of migraine headaches    Hyperlipidemia    Hyperthyroidism    Tobacco abuse     Past Surgical History:  Procedure Laterality Date   KNEE SURGERY Left    laproscopic   TONSILECTOMY, ADENOIDECTOMY, BILATERAL MYRINGOTOMY AND TUBES     TUBAL LIGATION      Prior to Admission medications   Medication Sig Start Date End Date Taking? Authorizing Provider  hydrocortisone (ANUSOL-HC) 25 MG suppository Place 1 suppository (25 mg total) rectally 2 (two) times daily. 08/13/20  Yes Margarette Canada, NP  methimazole (TAPAZOLE) 10 MG tablet Take 10 mg by mouth daily.   Yes [provider]  mometasone (NASONEX) 50 MCG/ACT nasal spray Place 2 sprays into the nose daily as needed.   Yes [provider]  ondansetron (ZOFRAN-ODT) 8 MG disintegrating tablet Take 1 tablet (8 mg total) by mouth every 8 (eight)  hours as needed for nausea. 08/24/18  Yes Lesleigh Noe, MD  tobramycin-dexamethasone [facility]) ophthalmic solution Place 2 drops into both eyes every 6 (six) hours. 11/27/19  Yes Margarette Canada, NP  valACYclovir (VALTREX) 1000 MG tablet Take 1,000 mg by mouth daily.   Yes [provider]  Vitamin D, Ergocalciferol, (DRISDOL) 1.25 MG (50000 UT) CAPS capsule Take by mouth. 12/20/17  Yes [provider]  albuterol (PROVENTIL HFA;VENTOLIN HFA) 108 (90 Base) MCG/ACT inhaler  03/11/18 11/27/19  [provider]    Family History  Adopted: Yes     Social History   Tobacco Use   Smoking status: Former    Types: Cigarettes    Quit date: 08/18/2011    Years since quitting: 9.3   Smokeless tobacco: Never  Vaping Use   Vaping Use: Never used  Substance Use Topics   Alcohol use: Yes   Drug use: No    Allergies as of 12/03/2020 - Review Complete 12/03/2020  Allergen Reaction Noted   Atenolol  07/25/2014   Codeine  07/11/2010    Review of Systems:    All systems reviewed and negative except where noted in HPI.   Physical Exam:  BP 131/75 (BP Location: Left Arm, Patient Position: Sitting, Cuff Size: Normal)   Pulse 90   Temp (!) 97.5 F (36.4 C) (Temporal)   Ht 5'  2" (1.575 m)   Wt 122 lb (55.3 kg)   BMI 22.31 kg/m  No LMP recorded. Patient is postmenopausal. General:   Alert,  Well-developed, well-nourished, pleasant and cooperative in NAD Head:  Normocephalic and atraumatic. Eyes:  Sclera clear, no icterus.   Conjunctiva pink. Ears:  Normal auditory acuity. Neck:  Supple; no masses or thyromegaly. Lungs:  Respirations even and unlabored.  Clear throughout to auscultation.   No wheezes, crackles, or rhonchi. No acute distress. Heart:  Regular rate and rhythm; no murmurs, clicks, rubs, or gallops. Abdomen:  Normal bowel sounds.  No bruits.  Soft, non-tender and non-distended without masses, hepatosplenomegaly or hernias noted.  No guarding or rebound  tenderness.  Negative Carnett sign.   Rectal:  Deferred.  Pulses:  Normal pulses noted. Extremities:  No clubbing or edema.  No cyanosis. Neurologic:  Alert and oriented x3;  grossly normal neurologically. Skin:  Intact without significant lesions or rashes.  No jaundice. Lymph Nodes:  No significant cervical adenopathy. Psych:  Alert and cooperative. Normal mood and affect.  Imaging Studies: No results found.  Assessment and Plan:   Lydia Carter is a 65 y.o. y/o female who comes in today with a history of a 60 pound weight loss over the last couple years with 20 pounds of it being this year that she states is from her thyroid disease.  The patient also has rectal bleeding but reported to be predominantly after she is constipated and she is pushing and straining.  Despite this the patient has not had a colonoscopy in many years and will be set up for colonoscopy to make sure that there is no other cause for her rectal bleeding or constipation with weight loss.  The patient has been explained the plan and agrees with it.    Lucilla Lame, MD. Mar[facility] Regal    Note: This dictation was prepared with Dragon dictation along with smaller phrase technology. Any transcriptional errors that result from this process are unintentional.

## 2020-12-03 NOTE — Progress Notes (Signed)
Gastroenterology Consultation  Referring Provider:     Margarette Canada, NP Primary Care Physician:  Lesleigh Noe, MD Primary Gastroenterologist:  Dr. Allen Norris     Reason for Consultation:     Rectal bleeding        HPI:   Lydia Carter is a 65 y.o. y/o female referred for consultation & management of Rectal bleeding by Dr. Einar Pheasant, Jobe Marker, MD.  This patient comes in today with a history of irritable bowel syndrome and she reports that she has been having some rectal bleeding.  The patient also reports that because of her thyroid disease she has lost approximately 60 pounds with 20 them being over the last year.  The patient states that her bright red blood from her rectum occurs usually after she is constipated. She reports that she has not had a colonoscopy in many years.  She states that she stopped drinking and smoking while being restricted to home during the code pandemic in taking care of her sick husband.  She denies any black stools or nausea or vomiting.   Past Medical History:  Diagnosis Date   Allergy    Arthritis    GERD (gastroesophageal reflux disease)    History of migraine headaches    Hyperlipidemia    Hyperthyroidism    Tobacco abuse     Past Surgical History:  Procedure Laterality Date   KNEE SURGERY Left    laproscopic   TONSILECTOMY, ADENOIDECTOMY, BILATERAL MYRINGOTOMY AND TUBES     TUBAL LIGATION      Prior to Admission medications   Medication Sig Start Date End Date Taking? Authorizing Provider  hydrocortisone (ANUSOL-HC) 25 MG suppository Place 1 suppository (25 mg total) rectally 2 (two) times daily. 08/13/20  Yes Margarette Canada, NP  methimazole (TAPAZOLE) 10 MG tablet Take 10 mg by mouth daily.   Yes [provider]  mometasone (NASONEX) 50 MCG/ACT nasal spray Place 2 sprays into the nose daily as needed.   Yes [provider]  ondansetron (ZOFRAN-ODT) 8 MG disintegrating tablet Take 1 tablet (8 mg total) by mouth every 8 (eight)  hours as needed for nausea. 08/24/18  Yes Lesleigh Noe, MD  tobramycin-dexamethasone [facility]) ophthalmic solution Place 2 drops into both eyes every 6 (six) hours. 11/27/19  Yes Margarette Canada, NP  valACYclovir (VALTREX) 1000 MG tablet Take 1,000 mg by mouth daily.   Yes [provider]  Vitamin D, Ergocalciferol, (DRISDOL) 1.25 MG (50000 UT) CAPS capsule Take by mouth. 12/20/17  Yes [provider]  albuterol (PROVENTIL HFA;VENTOLIN HFA) 108 (90 Base) MCG/ACT inhaler  03/11/18 11/27/19  [provider]    Family History  Adopted: Yes     Social History   Tobacco Use   Smoking status: Former    Types: Cigarettes    Quit date: 08/18/2011    Years since quitting: 9.3   Smokeless tobacco: Never  Vaping Use   Vaping Use: Never used  Substance Use Topics   Alcohol use: Yes   Drug use: No    Allergies as of 12/03/2020 - Review Complete 12/03/2020  Allergen Reaction Noted   Atenolol  07/25/2014   Codeine  07/11/2010    Review of Systems:    All systems reviewed and negative except where noted in HPI.   Physical Exam:  BP 131/75 (BP Location: Left Arm, Patient Position: Sitting, Cuff Size: Normal)   Pulse 90   Temp (!) 97.5 F (36.4 C) (Temporal)   Ht 5'  2" (1.575 m)   Wt 122 lb (55.3 kg)   BMI 22.31 kg/m  No LMP recorded. Patient is postmenopausal. General:   Alert,  Well-developed, well-nourished, pleasant and cooperative in NAD Head:  Normocephalic and atraumatic. Eyes:  Sclera clear, no icterus.   Conjunctiva pink. Ears:  Normal auditory acuity. Neck:  Supple; no masses or thyromegaly. Lungs:  Respirations even and unlabored.  Clear throughout to auscultation.   No wheezes, crackles, or rhonchi. No acute distress. Heart:  Regular rate and rhythm; no murmurs, clicks, rubs, or gallops. Abdomen:  Normal bowel sounds.  No bruits.  Soft, non-tender and non-distended without masses, hepatosplenomegaly or hernias noted.  No guarding or rebound  tenderness.  Negative Carnett sign.   Rectal:  Deferred.  Pulses:  Normal pulses noted. Extremities:  No clubbing or edema.  No cyanosis. Neurologic:  Alert and oriented x3;  grossly normal neurologically. Skin:  Intact without significant lesions or rashes.  No jaundice. Lymph Nodes:  No significant cervical adenopathy. Psych:  Alert and cooperative. Normal mood and affect.  Imaging Studies: No results found.  Assessment and Plan:   Lydia Carter is a 65 y.o. y/o female who comes in today with a history of a 60 pound weight loss over the last couple years with 20 pounds of it being this year that she states is from her thyroid disease.  The patient also has rectal bleeding but reported to be predominantly after she is constipated and she is pushing and straining.  Despite this the patient has not had a colonoscopy in many years and will be set up for colonoscopy to make sure that there is no other cause for her rectal bleeding or constipation with weight loss.  The patient has been explained the plan and agrees with it.    Lucilla Lame, MD. Marval Regal    Note: This dictation was prepared with Dragon dictation along with smaller phrase technology. Any transcriptional errors that result from this process are unintentional.

## 2020-12-04 ENCOUNTER — Encounter: Payer: Self-pay | Admitting: Gastroenterology

## 2020-12-20 ENCOUNTER — Ambulatory Visit: Payer: BC Managed Care – PPO | Admitting: Family Medicine

## 2020-12-21 ENCOUNTER — Ambulatory Visit: Payer: BC Managed Care – PPO | Admitting: Anesthesiology

## 2020-12-21 ENCOUNTER — Other Ambulatory Visit: Payer: Self-pay

## 2020-12-21 ENCOUNTER — Ambulatory Visit
Admission: RE | Admit: 2020-12-21 | Discharge: 2020-12-21 | Disposition: A | Discharge status: Home / Self Care | Payer: BC Managed Care – PPO | Attending: Gastroenterology | Admitting: Gastroenterology

## 2020-12-21 ENCOUNTER — Encounter: Payer: Self-pay | Admitting: Gastroenterology

## 2020-12-21 ENCOUNTER — Encounter
Admission: RE | Disposition: A | Discharge status: Home / Self Care | Payer: Self-pay | Source: Home / Self Care | Attending: Gastroenterology

## 2020-12-21 DIAGNOSIS — K641 Second degree hemorrhoids: Secondary | ICD-10-CM | POA: Insufficient documentation

## 2020-12-21 DIAGNOSIS — K625 Hemorrhage of anus and rectum: Secondary | ICD-10-CM | POA: Diagnosis not present

## 2020-12-21 DIAGNOSIS — Z79899 Other long term (current) drug therapy: Secondary | ICD-10-CM | POA: Diagnosis not present

## 2020-12-21 DIAGNOSIS — Z885 Allergy status to narcotic agent status: Secondary | ICD-10-CM | POA: Insufficient documentation

## 2020-12-21 DIAGNOSIS — Z888 Allergy status to other drugs, medicaments and biological substances status: Secondary | ICD-10-CM | POA: Insufficient documentation

## 2020-12-21 DIAGNOSIS — D120 Benign neoplasm of cecum: Secondary | ICD-10-CM | POA: Insufficient documentation

## 2020-12-21 DIAGNOSIS — K589 Irritable bowel syndrome without diarrhea: Secondary | ICD-10-CM | POA: Insufficient documentation

## 2020-12-21 DIAGNOSIS — K635 Polyp of colon: Secondary | ICD-10-CM | POA: Diagnosis not present

## 2020-12-21 DIAGNOSIS — R634 Abnormal weight loss: Secondary | ICD-10-CM | POA: Insufficient documentation

## 2020-12-21 DIAGNOSIS — Z87891 Personal history of nicotine dependence: Secondary | ICD-10-CM | POA: Insufficient documentation

## 2020-12-21 DIAGNOSIS — K921 Melena: Secondary | ICD-10-CM | POA: Diagnosis not present

## 2020-12-21 DIAGNOSIS — E0500 Thyrotoxicosis with diffuse goiter without thyrotoxic crisis or storm: Secondary | ICD-10-CM | POA: Diagnosis not present

## 2020-12-21 DIAGNOSIS — Z6821 Body mass index (BMI) 21.0-21.9, adult: Secondary | ICD-10-CM | POA: Diagnosis not present

## 2020-12-21 DIAGNOSIS — K5730 Diverticulosis of large intestine without perforation or abscess without bleeding: Secondary | ICD-10-CM | POA: Diagnosis not present

## 2020-12-21 HISTORY — DX: Presence of dental prosthetic device (complete) (partial): Z97.2

## 2020-12-21 HISTORY — PX: COLONOSCOPY WITH PROPOFOL: SHX5780

## 2020-12-21 HISTORY — DX: Thyrotoxicosis with diffuse goiter without thyrotoxic crisis or storm: E05.00

## 2020-12-21 HISTORY — DX: Simple febrile convulsions: R56.00

## 2020-12-21 HISTORY — DX: Other specified postprocedural states: Z98.890

## 2020-12-21 HISTORY — DX: Nausea with vomiting, unspecified: R11.2

## 2020-12-21 HISTORY — PX: POLYPECTOMY: SHX5525

## 2020-12-21 SURGERY — COLONOSCOPY WITH PROPOFOL
Anesthesia: General | Site: Rectum

## 2020-12-21 MED ORDER — ONDANSETRON HCL 4 MG/2ML IJ SOLN: 4.0000 mg | Freq: Once | INTRAMUSCULAR | Status: DC | PRN

## 2020-12-21 MED ORDER — PHENYLEPHRINE HCL (PRESSORS) 10 MG/ML IV SOLN
INTRAVENOUS | Status: DC | PRN
  Administered 2020-12-21: 100 ug via INTRAVENOUS

## 2020-12-21 MED ORDER — STERILE WATER FOR IRRIGATION IR SOLN
Status: DC | PRN
  Administered 2020-12-21: 1

## 2020-12-21 MED ORDER — ACETAMINOPHEN 160 MG/5ML PO SOLN: 325.0000 mg | ORAL | Status: DC | PRN

## 2020-12-21 MED ORDER — SODIUM CHLORIDE 0.9 % IV SOLN: INTRAVENOUS | Status: DC

## 2020-12-21 MED ORDER — PROPOFOL 10 MG/ML IV BOLUS
INTRAVENOUS | Status: DC | PRN
  Administered 2020-12-21: 70 mg via INTRAVENOUS
  Administered 2020-12-21: 50 mg via INTRAVENOUS

## 2020-12-21 MED ORDER — LIDOCAINE HCL (CARDIAC) PF 100 MG/5ML IV SOSY
PREFILLED_SYRINGE | INTRAVENOUS | Status: DC | PRN
  Administered 2020-12-21: 50 mg via INTRAVENOUS

## 2020-12-21 MED ORDER — ACETAMINOPHEN 325 MG PO TABS: 325.0000 mg | ORAL_TABLET | ORAL | Status: DC | PRN

## 2020-12-21 MED ORDER — LACTATED RINGERS IV SOLN: INTRAVENOUS | Status: DC

## 2020-12-21 SURGICAL SUPPLY — 8 items
GOWN CVR UNV OPN BCK APRN NK (MISCELLANEOUS) ×2 IMPLANT
GOWN ISOL THUMB LOOP REG UNIV (MISCELLANEOUS) ×4
KIT PRC NS LF DISP ENDO (KITS) ×1 IMPLANT
KIT PROCEDURE OLYMPUS (KITS) ×2
MANIFOLD NEPTUNE II (INSTRUMENTS) ×2 IMPLANT
SNARE COLD EXACTO (MISCELLANEOUS) ×1 IMPLANT
TRAP ETRAP POLY (MISCELLANEOUS) ×1 IMPLANT
WATER STERILE IRR 250ML POUR (IV SOLUTION) ×2 IMPLANT

## 2020-12-21 NOTE — Op Note (Addendum)
[facility] Gastroenterology Patient Name: Lydia Carter Procedure Date: 12/21/2020 7:14 AM MRN: [id number] Account #: [id number] Date of Birth: 1956-01-01 Admit Type: Outpatient Age: 65 Room: [facility] OR ROOM 01 Gender: Female Note Status: Finalized Instrument Name: [id number] Procedure:             Colonoscopy Indications:           Hematochezia Providers:             Lucilla Lame MD, MD Referring MD:          Juline Patch, MD (Referring MD) Medicines:             Propofol per Anesthesia Complications:         No immediate complications. Procedure:             Pre-Anesthesia Assessment:                        - Prior to the procedure, a History and Physical was                         performed, and patient medications and allergies were                         reviewed. The patient's tolerance of previous                         anesthesia was also reviewed. The risks and benefits                         of the procedure and the sedation options and risks                         were discussed with the patient. All questions were                         answered, and informed consent was obtained. Prior                         Anticoagulants: The patient has taken no previous                         anticoagulant or antiplatelet agents. ASA Grade                         Assessment: II - A patient with mild systemic disease.                         After reviewing the risks and benefits, the patient                         was deemed in satisfactory condition to undergo the                         procedure.                        After obtaining informed consent, the colonoscope was  passed under direct vision. Throughout the procedure,                         the patient's blood pressure, pulse, and oxygen                         saturations were monitored continuously. The                         Colonoscope was introduced through the anus  and                         advanced to the the cecum, identified by appendiceal                         orifice and ileocecal valve. The colonoscopy was                         performed without difficulty. The patient tolerated                         the procedure well. The quality of the bowel                         preparation was excellent. Findings:      The perianal and digital rectal examinations were normal.      Multiple small-mouthed diverticula were found in the sigmoid colon.      Non-bleeding internal hemorrhoids were found during retroflexion. The       hemorrhoids were Grade II (internal hemorrhoids that prolapse but reduce       spontaneously).      A 4 mm polyp was found in the cecum. The polyp was sessile. The polyp       was removed with a cold snare. Resection and retrieval were complete. Impression:            - Diverticulosis in the sigmoid colon.                        - Non-bleeding internal hemorrhoids.                        - One 4 mm polyp in the cecum, removed with a cold                         snare. Resected and retrieved. Recommendation:        - Discharge patient to home.                        - Resume previous diet.                        - Continue present medications.                        - Await pathology results.                        - If the pathology report reveals adenomatous tissue,  then repeat the colonoscopy for surveillance in 7                         years. Procedure Code(s):     --- Professional ---                        [phone], Colonoscopy, flexible; with removal of                         tumor(s), polyp(s), or other lesion(s) by snare                         technique Diagnosis Code(s):     --- Professional ---                        K92.1, Melena (includes Hematochezia)                        K63.5, Polyp of colon CPT copyright 2019 American Medical Association. All rights reserved. The codes  documented in this report are preliminary and upon coder review may  be revised to meet current compliance requirements. Lucilla Lame MD, MD 12/21/2020 7:47:40 AM This report has been signed electronically. Number of Addenda: 0 Note Initiated On: 12/21/2020 7:14 AM Scope Withdrawal Time: 0 hours 6 minutes 47 seconds  Total Procedure Duration: 0 hours 8 minutes 56 seconds  Estimated Blood Loss:  Estimated blood loss: none.      [facility]

## 2020-12-21 NOTE — Interval H&P Note (Signed)
Lucilla Lame, MD [address] [place], [place] [address] [phone] Fax : [phone]  Primary Care Physician:  Juline Patch, MD Primary Gastroenterologist:  Dr. Allen Norris  Pre-Procedure History & Physical: HPI:  Lydia Carter is a 65 y.o. female is here for an colonoscopy.   Past Medical History:  Diagnosis Date   Allergy    Arthritis    Febrile seizure ([place])    childhood   GERD (gastroesophageal reflux disease)    Graves disease    History of migraine headaches    Hyperlipidemia    Hyperthyroidism    PONV (postoperative nausea and vomiting)    Tobacco abuse    Wears dentures    full upper    Past Surgical History:  Procedure Laterality Date   KNEE SURGERY Left    laproscopic   TONSILECTOMY, ADENOIDECTOMY, BILATERAL MYRINGOTOMY AND TUBES     TUBAL LIGATION      Prior to Admission medications   Medication Sig Start Date End Date Taking? Authorizing Provider  fexofenadine (ALLEGRA) 180 MG tablet Take 180 mg by mouth daily.   Yes [provider]  hydrocortisone (ANUSOL-HC) 25 MG suppository Place 1 suppository (25 mg total) rectally 2 (two) times daily. 08/13/20  Yes Margarette Canada, NP  methimazole (TAPAZOLE) 10 MG tablet Take 10 mg by mouth daily.   Yes [provider]  mometasone (NASONEX) 50 MCG/ACT nasal spray Place 2 sprays into the nose daily as needed.   Yes [provider]  ondansetron (ZOFRAN-ODT) 8 MG disintegrating tablet Take 1 tablet (8 mg total) by mouth every 8 (eight) hours as needed for nausea. 08/24/18  Yes Lesleigh Noe, MD  tobramycin-dexamethasone [facility]) ophthalmic solution Place 2 drops into both eyes every 6 (six) hours. 11/27/19  Yes Margarette Canada, NP  valACYclovir (VALTREX) 1000 MG tablet Take 1,000 mg by mouth daily.   Yes [provider]  Vitamin D, Ergocalciferol, (DRISDOL) 1.25 MG (50000 UT) CAPS capsule Take by mouth. 12/20/17  Yes [provider]  Sod Picosulfate-Mag  Ox-Cit Acd (CLENPIQ) 10-3.5-12 MG-GM -GM/160ML SOLN Take 320 mLs by mouth as directed. 12/03/20   Lucilla Lame, MD  albuterol (PROVENTIL HFA;VENTOLIN HFA) 108 [phone] Base) MCG/ACT inhaler  03/11/18 11/27/19  [provider]    Allergies as of 12/03/2020 - Review Complete 12/03/2020  Allergen Reaction Noted   Atenolol  07/25/2014   Codeine  07/11/2010    Family History  Adopted: Yes    Social History   Socioeconomic History   Marital status: Married    Spouse name: Not on file   Number of children: Not on file   Years of education: Not on file   Highest education level: Not on file  Occupational History   Not on file  Tobacco Use   Smoking status: Former    Types: Cigarettes    Quit date: 08/18/2011    Years since quitting: 9.3   Smokeless tobacco: Never  Vaping Use   Vaping Use: Never used  Substance and Sexual Activity   Alcohol use: Not Currently   Drug use: No   Sexual activity: Yes  Other Topics Concern   Not on file  Social History Narrative   Not on file   Social Determinants of Health   Financial Resource Strain: Not on file  Food Insecurity: Not on file  Transportation Needs: Not on file  Physical Activity: Not on file  Stress: Not on file  Social Connections: Not on file  Intimate  Partner Violence: Not on file    Review of Systems: See HPI, otherwise negative ROS  Physical Exam: BP 116/88   Pulse 94   Temp 99 F (37.2 C) (Temporal)   Resp 13   Ht 5\' 2"  (1.575 m)   Wt 53.5 kg   SpO2 98%   BMI 21.58 kg/m  General:   Alert,  pleasant and cooperative in NAD Head:  Normocephalic and atraumatic. Neck:  Supple; no masses or thyromegaly. Lungs:  Clear throughout to auscultation.    Heart:  Regular rate and rhythm. Abdomen:  Soft, nontender and nondistended. Normal bowel sounds, without guarding, and without rebound.   Neurologic:  Alert and  oriented x4;  grossly normal neurologically.  Impression/Plan: Lydia Carter is here for an  colonoscopy to be performed for rectal bleeding  Risks, benefits, limitations, and alternatives regarding  colonoscopy have been reviewed with the patient.  Questions have been answered.  All parties agreeable.   Lucilla Lame, MD  12/21/2020, 7:24 AM

## 2020-12-21 NOTE — Anesthesia Preprocedure Evaluation (Addendum)
Anesthesia Evaluation  Patient identified by MRN, date of birth, ID band Patient awake    Reviewed: Allergy & Precautions, H&P , NPO status , Patient's Chart, lab work & pertinent test results, reviewed documented beta blocker date and time   History of Anesthesia Complications (+) PONV and history of anesthetic complications (PONV with Knee scope, in distant past had emotional lability immediately after endoscopy)  Airway Mallampati: II  TM Distance: >3 FB Neck ROM: full    Dental no notable dental hx.    Pulmonary neg pulmonary ROS, former smoker,    Pulmonary exam normal breath sounds clear to auscultation       Cardiovascular Exercise Tolerance: Good negative cardio ROS   Rhythm:regular Rate:Normal     Neuro/Psych  Headaches, Seizures - (febrile in childhood),  negative psych ROS   GI/Hepatic Neg liver ROS, GERD  ,  Endo/Other  Hyperthyroidism   Renal/GU negative Renal ROS  negative genitourinary   Musculoskeletal   Abdominal   Peds  Hematology negative hematology ROS (+)   Anesthesia Other Findings   Reproductive/Obstetrics negative OB ROS                            Anesthesia Physical Anesthesia Plan  ASA: 2  Anesthesia Plan: General   Post-op Pain Management:    Induction:   PONV Risk Score and Plan: 3 and TIVA, Propofol infusion and Treatment may vary due to age or medical condition  Airway Management Planned:   Additional Equipment:   Intra-op Plan:   Post-operative Plan:   Informed Consent: I have reviewed the patients History and Physical, chart, labs and discussed the procedure including the risks, benefits and alternatives for the proposed anesthesia with the patient or authorized representative who has indicated his/her understanding and acceptance.     Dental Advisory Given  Plan Discussed with: CRNA  Anesthesia Plan Comments:        Anesthesia  Quick Evaluation

## 2020-12-21 NOTE — Transfer of Care (Signed)
Immediate Anesthesia Transfer of Care Note  Patient: Lydia Carter  Procedure(s) Performed: COLONOSCOPY WITH BIOPSY (Rectum) POLYPECTOMY (Rectum)  Patient Location: PACU  Anesthesia Type: General  Level of Consciousness: awake, alert  and patient cooperative  Airway and Oxygen Therapy: Patient Spontanous Breathing and Patient connected to supplemental oxygen  Post-op Assessment: Post-op Vital signs reviewed, Patient's Cardiovascular Status Stable, Respiratory Function Stable, Patent Airway and No signs of Nausea or vomiting  Post-op Vital Signs: Reviewed and stable  Complications: No notable events documented.

## 2020-12-21 NOTE — Anesthesia Postprocedure Evaluation (Signed)
Anesthesia Post Note  Patient: Lydia Carter  Procedure(s) Performed: COLONOSCOPY WITH BIOPSY (Rectum) POLYPECTOMY (Rectum)     Patient location during evaluation: PACU Anesthesia Type: General Level of consciousness: awake and alert Pain management: pain level controlled Vital Signs Assessment: post-procedure vital signs reviewed and stable Respiratory status: spontaneous breathing, nonlabored ventilation and respiratory function stable Cardiovascular status: blood pressure returned to baseline and stable Postop Assessment: no apparent nausea or vomiting Anesthetic complications: no   No notable events documented.  April Manson

## 2020-12-24 ENCOUNTER — Encounter: Payer: Self-pay | Admitting: Gastroenterology

## 2020-12-25 LAB — SURGICAL PATHOLOGY

## 2020-12-26 ENCOUNTER — Encounter: Payer: Self-pay | Admitting: Gastroenterology

## 2021-04-02 ENCOUNTER — Encounter: Payer: Self-pay | Admitting: Family Medicine

## 2021-04-02 ENCOUNTER — Other Ambulatory Visit: Payer: Self-pay

## 2021-04-02 ENCOUNTER — Ambulatory Visit (INDEPENDENT_AMBULATORY_CARE_PROVIDER_SITE_OTHER): Payer: BC Managed Care – PPO | Admitting: Family Medicine

## 2021-04-02 VITALS — BP 128/68 | HR 84 | Ht 62.0 in | Wt 121.0 lb

## 2021-04-02 DIAGNOSIS — G43B0 Ophthalmoplegic migraine, not intractable: Secondary | ICD-10-CM | POA: Diagnosis not present

## 2021-04-02 DIAGNOSIS — E0590 Thyrotoxicosis, unspecified without thyrotoxic crisis or storm: Secondary | ICD-10-CM | POA: Diagnosis not present

## 2021-04-02 DIAGNOSIS — B349 Viral infection, unspecified: Secondary | ICD-10-CM

## 2021-04-02 DIAGNOSIS — Z7689 Persons encountering health services in other specified circumstances: Secondary | ICD-10-CM | POA: Diagnosis not present

## 2021-04-02 DIAGNOSIS — E559 Vitamin D deficiency, unspecified: Secondary | ICD-10-CM | POA: Diagnosis not present

## 2021-04-02 DIAGNOSIS — J302 Other seasonal allergic rhinitis: Secondary | ICD-10-CM

## 2021-04-02 DIAGNOSIS — N814 Uterovaginal prolapse, unspecified: Secondary | ICD-10-CM

## 2021-04-02 MED ORDER — VITAMIN D (ERGOCALCIFEROL) 1.25 MG (50000 UNIT) PO CAPS: 50000.0000 [IU] | ORAL_CAPSULE | ORAL | 5 refills | Status: DC

## 2021-04-02 MED ORDER — FEXOFENADINE HCL 180 MG PO TABS: 180.0000 mg | ORAL_TABLET | Freq: Every day | ORAL | 5 refills | Status: DC

## 2021-04-02 MED ORDER — VALACYCLOVIR HCL 1 G PO TABS: 1000.0000 mg | ORAL_TABLET | Freq: Every day | ORAL | 0 refills | Status: DC

## 2021-04-02 MED ORDER — MOMETASONE FUROATE 50 MCG/ACT NA SUSP: 2.0000 | Freq: Every day | NASAL | 5 refills | Status: DC | PRN

## 2021-04-02 MED ORDER — ONDANSETRON 8 MG PO TBDP: 8.0000 mg | ORAL_TABLET | Freq: Three times a day (TID) | ORAL | 0 refills | Status: DC | PRN

## 2021-04-02 NOTE — Progress Notes (Signed)
Date:  04/02/2021   Name:  Lydia Carter   DOB:  Aug 17, 1955   MRN:  [id number]   Chief Complaint: Establish Care (Needed pcp)  Patient is a 66 year old female who presents for a establish care exam. The patient reports the following problems: hyperthyroid/ migraine/ vaginal prolapse. Health maintenance has been reviewed up to date.     Lab Results  Component Value Date   NA 140 08/24/2018   K 3.7 08/24/2018   CO2 26 08/24/2018   GLUCOSE 89 08/24/2018   BUN 5 (L) 08/24/2018   CREATININE 0.84 08/24/2018   CALCIUM 8.7 08/24/2018   Lab Results  Component Value Date   CHOL 180 03/09/2017   HDL 37.20 (L) 03/09/2017   LDLCALC 116 (H) 03/09/2017   LDLDIRECT 131.0 02/28/2016   TRIG 136.0 03/09/2017   CHOLHDL 5 03/09/2017   Lab Results  Component Value Date   TSH <0.01 (L) 01/12/2017   No results found for: HGBA1C Lab Results  Component Value Date   WBC 10.2 03/09/2017   HGB 12.7 03/09/2017   HCT 38.8 03/09/2017   MCV 87.6 03/09/2017   PLT 264.0 03/09/2017   Lab Results  Component Value Date   ALT 7 08/24/2018   AST 13 08/24/2018   ALKPHOS 85 08/24/2018   BILITOT 0.4 08/24/2018   Lab Results  Component Value Date   VD25OH 11.78 (L) 03/09/2017     Review of Systems  Constitutional:  Negative for chills and fever.  HENT:  Negative for drooling, ear discharge, ear pain and sore throat.   Respiratory:  Negative for cough, shortness of breath and wheezing.   Cardiovascular:  Negative for chest pain, palpitations and leg swelling.  Gastrointestinal:  Negative for abdominal pain, blood in stool, constipation, diarrhea and nausea.  Endocrine: Negative for polydipsia.  Genitourinary:  Negative for dysuria, frequency, hematuria and urgency.  Musculoskeletal:  Negative for back pain, myalgias and neck pain.  Skin:  Negative for rash.  Allergic/Immunologic: Negative for environmental allergies.  Neurological:  Negative for dizziness and headaches.  Hematological:   Does not bruise/bleed easily.  Psychiatric/Behavioral:  Negative for suicidal ideas. The patient is not nervous/anxious.    Patient Active Problem List   Diagnosis Date Noted   Rectal bleeding    Polyp of colon    Elevated blood pressure reading 04/15/2018   Vitamin D deficiency 12/18/2017   BCC (basal cell carcinoma) 09/07/2017   GERD (gastroesophageal reflux disease) 01/12/2017   HLD (hyperlipidemia) 01/12/2017   Hyperthyroidism 02/28/2016   Adjustment disorder with mixed anxiety and depressed mood 02/28/2016   Migraine 07/25/2014   Allergic rhinitis 07/25/2014   Arthritis 07/25/2014    Allergies  Allergen Reactions   Atenolol     Made her "emotional and cry"   Codeine Nausea And Vomiting    Past Surgical History:  Procedure Laterality Date   COLONOSCOPY WITH PROPOFOL N/A 12/21/2020   Procedure: COLONOSCOPY WITH BIOPSY;  Surgeon: Lucilla Lame, MD;  Location: [place];  Service: Endoscopy;  Laterality: N/A;   KNEE SURGERY Left    laproscopic   POLYPECTOMY N/A 12/21/2020   Procedure: POLYPECTOMY;  Surgeon: Lucilla Lame, MD;  Location: [place];  Service: Endoscopy;  Laterality: N/A;   TONSILECTOMY, ADENOIDECTOMY, BILATERAL MYRINGOTOMY AND TUBES     TUBAL LIGATION      Social History   Tobacco Use   Smoking status: Former    Types: Cigarettes    Quit date: 08/18/2011  Years since quitting: 9.6   Smokeless tobacco: Never  Vaping Use   Vaping Use: Never used  Substance Use Topics   Alcohol use: Not Currently   Drug use: No     Medication list has been reviewed and updated.  Current Meds  Medication Sig   fexofenadine (ALLEGRA) 180 MG tablet Take 180 mg by mouth daily.   methimazole (TAPAZOLE) 10 MG tablet Take 10 mg by mouth daily.   mometasone (NASONEX) 50 MCG/ACT nasal spray Place 2 sprays into the nose daily as needed.   ondansetron (ZOFRAN-ODT) 8 MG disintegrating tablet Take 1 tablet (8 mg total) by mouth every 8 (eight) hours as  needed for nausea.   valACYclovir (VALTREX) 1000 MG tablet Take 1,000 mg by mouth daily.   Vitamin D, Ergocalciferol, (DRISDOL) 1.25 MG (50000 UT) CAPS capsule Take by mouth.    PHQ 2/9 Scores 04/02/2021 08/24/2018 01/12/2017 09/28/2015  PHQ - 2 Score 0 0 0 1  PHQ- 9 Score 0 - - -    GAD 7 : Generalized Anxiety Score 04/02/2021 09/28/2015  Nervous, Anxious, on Edge 0 3  Control/stop worrying 0 3  Worry too much - different things 0 3  Trouble relaxing 0 3  Restless 0 3  Easily annoyed or irritable 0 0  Afraid - awful might happen 0 1  Total GAD 7 Score 0 16  Anxiety Difficulty Not difficult at all Somewhat difficult    BP Readings from Last 3 Encounters:  04/02/21 128/68  12/21/20 (!) 104/59  12/03/20 131/75    Physical Exam Vitals and nursing note reviewed. Exam conducted with a chaperone present.  Constitutional:      General: She is not in acute distress.    Appearance: She is not diaphoretic.  HENT:     Head: Normocephalic and atraumatic.     Right Ear: Tympanic membrane, ear canal and external ear normal. There is no impacted cerumen.     Left Ear: Tympanic membrane, ear canal and external ear normal. There is no impacted cerumen.     Nose: Nose normal.  Eyes:     General:        Right eye: No discharge.        Left eye: No discharge.     Conjunctiva/sclera: Conjunctivae normal.     Pupils: Pupils are equal, round, and reactive to light.  Neck:     Thyroid: No thyromegaly.     Vascular: No JVD.  Cardiovascular:     Rate and Rhythm: Normal rate and regular rhythm.     Heart sounds: Normal heart sounds. No murmur heard.   No friction rub. No gallop.  Pulmonary:     Effort: Pulmonary effort is normal.     Breath sounds: Normal breath sounds.  Abdominal:     General: Bowel sounds are normal.     Palpations: Abdomen is soft. There is no mass.     Tenderness: There is no abdominal tenderness. There is no guarding.  Musculoskeletal:        General: Normal range of  motion.     Cervical back: Normal range of motion and neck supple.  Lymphadenopathy:     Cervical: No cervical adenopathy.  Skin:    General: Skin is warm and dry.  Neurological:     Mental Status: She is alert.     Deep Tendon Reflexes: Reflexes are normal and symmetric.    Wt Readings from Last 3 Encounters:  04/02/21 121 lb (54.9 kg)  12/21/20 118 lb (53.5 kg)  12/03/20 122 lb (55.3 kg)    BP 128/68    Pulse 84    Ht 5\' 2"  (1.575 m)    Wt 121 lb (54.9 kg)    BMI 22.13 kg/m   Assessment and Plan:  1. Establishing care with new doctor, encounter for Patient establishing care with new physician.  Discussed multiple aspects of her medical management.  Patient will be returning in a few months fasting for evaluation of lipid panel with history of elevated cholesterol in the past  2. Ophthalmoplegic migraine, not intractable Patient has history of episodic ophthalmologic migraines.  This is treated only with Zofran 1 tablet every 8 hours as needed headache. - ondansetron (ZOFRAN-ODT) 8 MG disintegrating tablet; Take 1 tablet (8 mg total) by mouth every 8 (eight) hours as needed for nausea.  Dispense: 20 tablet; Refill: 0  3. Vitamin D deficiency Patient with history of vitamin D deficiency we will make certain that she can continue with her vitamin D until she sees Dr. Honor Junes. - Vitamin D, Ergocalciferol, (DRISDOL) 1.25 MG (50000 UNIT) CAPS capsule; Take 1 capsule (50,000 Units total) by mouth every 7 (seven) days.  Dispense: 5 capsule; Refill: 5  4. Hyperthyroidism Followed by Dr. Honor Junes.  Patient had thyroid functions done in November.  Per his instructions.  She is scheduled to see him in August of this year.  5. Seasonal allergic rhinitis, unspecified trigger Chronic.  Episodic.  Stable.  Continue Nasonex and Allegra on as needed basis - mometasone (NASONEX) 50 MCG/ACT nasal spray; Place 2 sprays into the nose daily as needed.  Dispense: 1 each; Refill: 5 - fexofenadine  (ALLEGRA) 180 MG tablet; Take 1 tablet (180 mg total) by mouth daily.  Dispense: 30 tablet; Refill: 5  6. 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  On occasions when she develops this right trigeminal discomfort and she treats that with Valtrex 1 g daily and we will refill her antiviral regimen. - valACYclovir (VALTREX) 1000 MG tablet; Take 1 tablet (1,000 mg total) by mouth daily.  Dispense: 30 tablet; Refill: 0  7. Prolapse, uterovaginal Patient has recently had a worsening of her uterovaginal prolapse which is complicating her urination.  Referral to gynecologist for evaluation and treatment thereof. - Ambulatory referral to Gynecology

## 2021-07-29 ENCOUNTER — Ambulatory Visit (INDEPENDENT_AMBULATORY_CARE_PROVIDER_SITE_OTHER): Payer: BC Managed Care – PPO | Admitting: Obstetrics and Gynecology

## 2021-07-29 ENCOUNTER — Encounter: Payer: Self-pay | Admitting: Obstetrics and Gynecology

## 2021-07-29 VITALS — BP 137/77 | HR 87 | Ht 62.0 in | Wt 119.0 lb

## 2021-07-29 DIAGNOSIS — R350 Frequency of micturition: Secondary | ICD-10-CM

## 2021-07-29 DIAGNOSIS — N812 Incomplete uterovaginal prolapse: Secondary | ICD-10-CM | POA: Diagnosis not present

## 2021-07-29 DIAGNOSIS — Z01419 Encounter for gynecological examination (general) (routine) without abnormal findings: Secondary | ICD-10-CM | POA: Diagnosis not present

## 2021-07-29 DIAGNOSIS — N8110 Cystocele, unspecified: Secondary | ICD-10-CM

## 2021-07-29 DIAGNOSIS — K5904 Chronic idiopathic constipation: Secondary | ICD-10-CM

## 2021-07-29 LAB — POCT URINALYSIS DIPSTICK
Appearance: NORMAL
Bilirubin, UA: NEGATIVE
Blood, UA: NEGATIVE
Glucose, UA: NEGATIVE
Ketones, UA: NEGATIVE
Leukocytes, UA: NEGATIVE
Nitrite, UA: NEGATIVE
Protein, UA: NEGATIVE
Spec Grav, UA: <=1.005 — AB (ref 1.010–1.025)
Urobilinogen, UA: 0.2 E.U./dL
pH, UA: 5.5 (ref 5.0–8.0)

## 2021-07-29 NOTE — Progress Notes (Signed)
[facility] New Patient Evaluation and Consultation  Referring Provider: Juline Patch, MD PCP: Juline Patch, MD Date of Service: 07/29/2021  SUBJECTIVE Chief Complaint: New Patient (Initial Visit) Lydia Carter is a 66 y.o. female here for a consult.)  History of Present Illness: Lydia Carter is a 66 y.o. White or Caucasian female seen in consultation at the request of Dr. Ronnald Ramp for evaluation of prolapse.    Review of records from Dr Ronnald Ramp significant for: Has vaginal prolapse, making it difficult to empty her bladder at times.   Urinary Symptoms: Does not leak urine.   Day time voids- depends on water intake.  Nocturia: 0-1 times per night to void. Voiding dysfunction: she empties her bladder well.  does not use a catheter to empty bladder.  When urinating, she feels difficulty starting urine stream  UTIs:  0  UTI's in the last year.   Denies history of blood in urine and kidney or bladder stones  Pelvic Organ Prolapse Symptoms:                  She Admits to a feeling of a bulge the vaginal area. Last fall her prolapse got a lot worse after she had a run of constipation and was straining.  She Admits to seeing a bulge.  This bulge is bothersome. She has done pelvic physical therapy.   Bowel Symptom: Bowel movements: every day or every other day Stool consistency: hard, soft , or loose Straining: yes, sometimes Splinting: no.  Incomplete evacuation: no.  She Denies accidental bowel leakage / fecal incontinence Bowel regimen: none- drinks water and hot tea Last colonoscopy-   Sexual Function Sexually active: no.   Pelvic Pain Denies pelvic pain   Past Medical History:  Past Medical History:  Diagnosis Date   Allergy    Arthritis    Febrile seizure ([place])    childhood   GERD (gastroesophageal reflux disease)    Graves disease    History of migraine headaches    Hyperlipidemia    Hyperthyroidism    PONV (postoperative nausea and  vomiting)    Tobacco abuse    Wears dentures    full upper     Past Surgical History:   Past Surgical History:  Procedure Laterality Date   COLONOSCOPY WITH PROPOFOL N/A 12/21/2020   Procedure: COLONOSCOPY WITH BIOPSY;  Surgeon: Lucilla Lame, MD;  Location: [facility];  Service: Endoscopy;  Laterality: N/A;   KNEE SURGERY Left    laproscopic   POLYPECTOMY N/A 12/21/2020   Procedure: POLYPECTOMY;  Surgeon: Lucilla Lame, MD;  Location: [facility];  Service: Endoscopy;  Laterality: N/A;   TONSILECTOMY, ADENOIDECTOMY, BILATERAL MYRINGOTOMY AND TUBES     TUBAL LIGATION       Past OB/GYN History: OB History  Gravida Para Term Preterm AB Living  '1 1       1  '$ SAB IAB Ectopic Multiple Live Births          1    # Outcome Date GA Lbr Len/2nd Weight Sex Delivery Anes PTL Lv  1 Para 1976    F Vag-Spont      Menopausal: Denies vaginal bleeding since menopause Last pap smear was 2016- negative.     Medications: She has a current medication list which includes the following prescription(s): fexofenadine, hydrocortisone, methimazole, mometasone, ondansetron, valacyclovir, vitamin d (ergocalciferol), and [DISCONTINUED] albuterol.   Allergies: Patient is allergic to atenolol and codeine.   Social History:  Social History   Tobacco Use   Smoking status: Former    Types: Cigarettes    Quit date: 08/18/2011    Years since quitting: 9.9   Smokeless tobacco: Never  Vaping Use   Vaping Use: Never used  Substance Use Topics   Alcohol use: Not Currently   Drug use: No    Relationship status: married She lives with husband.   She is employed as an Optometrist- Artist. Regular exercise: No History of abuse: No  Family History:   Family History  Adopted: Yes     Review of Systems: Review of Systems  Constitutional:  Negative for fever, malaise/fatigue and weight loss.  Respiratory:  Negative for cough, shortness of breath and wheezing.   Cardiovascular:   Negative for chest pain, palpitations and leg swelling.  Gastrointestinal:  Negative for abdominal pain and blood in stool.  Genitourinary:  Negative for dysuria.  Musculoskeletal:  Negative for myalgias.  Skin:  Negative for rash.  Neurological:  Negative for dizziness and headaches.  Endo/Heme/Allergies:  Does not bruise/bleed easily.  Psychiatric/Behavioral:  Negative for depression. The patient is not nervous/anxious.      OBJECTIVE Physical Exam: Vitals:   07/29/21 1313  BP: 137/77  Pulse: 87  Weight: 119 lb (54 kg)  Height: '5\' 2"'$  (1.575 m)    Physical Exam Constitutional:      General: She is not in acute distress. Pulmonary:     Effort: Pulmonary effort is normal.  Abdominal:     General: There is no distension.     Palpations: Abdomen is soft.     Tenderness: There is no abdominal tenderness. There is no rebound.  Musculoskeletal:        General: No swelling. Normal range of motion.  Skin:    General: Skin is warm and dry.     Findings: No rash.  Neurological:     Mental Status: She is alert and oriented to person, place, and time.  Psychiatric:        Mood and Affect: Mood normal.        Behavior: Behavior normal.      GU / Detailed Urogynecologic Evaluation:  Pelvic Exam: Normal external female genitalia; Bartholin's and Skene's glands normal in appearance; urethral meatus normal in appearance, no urethral masses or discharge.   CST: negative   Speculum exam reveals normal vaginal mucosa with atrophy. Cervix normal appearance. Uterus normal single, nontender. Adnexa no mass, fullness, tenderness.    Pelvic floor strength II/V  Pelvic floor musculature: Right levator non-tender, Right obturator non-tender, Left levator non-tender, Left obturator non-tender  POP-Q:   POP-Q  0                                            Aa   0                                           Ba  2.5                                              C   4  Gh  3                                            Pb  7                                            tvl   -2                                            Ap  -2                                            Bp  -4                                              D     Rectal Exam:  Normal external rectum  Post-Void Residual (PVR) by Bladder Scan: In order to evaluate bladder emptying, we discussed obtaining a postvoid residual and she agreed to this procedure.  Procedure: The ultrasound unit was placed on the patient's abdomen in the suprapubic region after the patient had voided. A PVR of 1 ml was obtained by bladder scan.  Laboratory Results: POC urine: negative   ASSESSMENT AND PLAN Lydia Carter is a 66 y.o. with:  1. Uterovaginal prolapse, incomplete   2. Prolapse of anterior vaginal wall   3. Urinary frequency   4. Well woman exam   5. Chronic idiopathic constipation    Stage II anterior, Stage I posterior, Stage III apical prolapse - For treatment of pelvic organ prolapse, we discussed options for management including expectant management, conservative management, and surgical management, such as Kegels, a pessary, pelvic floor physical therapy, and specific surgical procedures. 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 She is interested in a pessary. Will have her return for a fitting.   2. Needs well woman exam - referral made to general GYN for updated pap and mamogram  3. Constipation - discussed adding daily fiber supplement to prevent constipation and straining.   Jaquita Folds, MD

## 2021-07-29 NOTE — Patient Instructions (Addendum)
Constipation: Our goal is to achieve formed bowel movements daily or every-other-day.  You may need to try different combinations of the following options to find what works best for you - everybody's body works differently so feel free to adjust the dosages as needed.  Some options to help maintain bowel health include:  Dietary changes (more leafy greens, vegetables and fruits; less processed foods) Fiber supplementation (Benefiber, FiberCon, Metamucil or Psyllium). Start slow and increase gradually to full dose. Over-the-counter agents such as: stool softeners (Docusate or Colace) and/or laxatives (Miralax, milk of magnesia)  "Power Pudding" is a natural mixture that may help your constipation.  To make blend 1 cup applesauce, 1 cup wheat bran, and 3/4 cup prune juice, refrigerate and then take 1 tablespoon daily with a large glass of water as needed.  You have a stage 3 (out of 4) prolapse.  We discussed the fact that it is not life threatening but there are several treatment options. For treatment of pelvic organ prolapse, we discussed options for management including expectant management, conservative management, and surgical management, such as Kegels, a pessary, pelvic floor physical therapy, and specific surgical procedures.

## 2021-09-03 ENCOUNTER — Encounter: Payer: Self-pay | Admitting: Obstetrics and Gynecology

## 2021-09-03 ENCOUNTER — Ambulatory Visit (INDEPENDENT_AMBULATORY_CARE_PROVIDER_SITE_OTHER): Payer: BC Managed Care – PPO | Admitting: Obstetrics and Gynecology

## 2021-09-03 VITALS — BP 130/73 | HR 85

## 2021-09-03 DIAGNOSIS — N812 Incomplete uterovaginal prolapse: Secondary | ICD-10-CM

## 2021-09-03 NOTE — Progress Notes (Signed)
[facility]   Subjective:     Chief Complaint: Pessary Check  History of Present Illness: Lydia Carter is a 66 y.o. female with stage III pelvic organ prolapse who presents today for a pessary fitting.    Past Medical History: Patient  has a past medical history of Allergy, Arthritis, Febrile seizure ([place]), GERD (gastroesophageal reflux disease), Graves disease, History of migraine headaches, Hyperlipidemia, Hyperthyroidism, PONV (postoperative nausea and vomiting), Tobacco abuse, and Wears dentures.   Past Surgical History: She  has a past surgical history that includes Tonsilectomy, adenoidectomy, bilateral myringotomy and tubes; Tubal ligation; Knee surgery (Left); Colonoscopy with propofol (N/A, 12/21/2020); and polypectomy (N/A, 12/21/2020).   Medications: She has a current medication list which includes the following prescription(s): fexofenadine, hydrocortisone, methimazole, mometasone, ondansetron, valacyclovir, vitamin d (ergocalciferol), and [DISCONTINUED] albuterol.   Allergies: Patient is allergic to atenolol and codeine.   Social History: Patient  reports that she quit smoking about 10 years ago. Her smoking use included cigarettes. She has never used smokeless tobacco. She reports that she does not currently use alcohol. She reports that she does not use drugs.      Objective:    BP 130/73   Pulse 85  Gen: No apparent distress, A&O x 3. Pelvic Exam: Normal external female genitalia; Bartholin's and Skene's glands normal in appearance; urethral meatus normal in appearance, no urethral masses or discharge.   A size #4 ring pessary was placed but was too large. This was removed and a #3 ring with support pessary was fitted. It was comfortable, stayed in place with valsalva and was an appropriate size on examination, with one finger fitting between the pessary and the vaginal walls. The patient demonstrated proper removal and replacement.   POP-Q:     POP-Q   0                                            Aa   0                                           Ba   2.5                                              C    4                                            Gh   3                                            Pb   7                                            tvl    -2  Ap   -2                                            Bp   -4                                              D      Assessment/Plan:    Assessment: Lydia Carter is a 66 y.o. with stage III pelvic organ prolapse who presents for a pessary fitting.  Plan: She was fitted with a #3 ring with support pessary. She will remove at least weekly . She will use coconut oil for lubricant.  Follow-up in 3-4 weeks for a pessary check or sooner as needed.     Jaquita Folds, MD

## 2021-09-03 NOTE — Patient Instructions (Signed)
You were fitted with a #3 ring pessary.  Come back in 3-4 weeks to see how it is working.  You can take it out every week, or sooner if you desire. Clean with liquid soap and water in between uses and leave out over night on the night that you remove it. Call for any problems.

## 2021-09-30 ENCOUNTER — Ambulatory Visit: Payer: BC Managed Care – PPO | Admitting: Family Medicine

## 2021-10-09 ENCOUNTER — Ambulatory Visit (INDEPENDENT_AMBULATORY_CARE_PROVIDER_SITE_OTHER): Payer: BC Managed Care – PPO | Admitting: Obstetrics and Gynecology

## 2021-10-09 ENCOUNTER — Encounter: Payer: BC Managed Care – PPO | Admitting: Obstetrics and Gynecology

## 2021-10-09 ENCOUNTER — Encounter: Payer: Self-pay | Admitting: Obstetrics and Gynecology

## 2021-10-09 VITALS — BP 131/78 | HR 79

## 2021-10-09 DIAGNOSIS — R159 Full incontinence of feces: Secondary | ICD-10-CM | POA: Diagnosis not present

## 2021-10-09 DIAGNOSIS — N812 Incomplete uterovaginal prolapse: Secondary | ICD-10-CM

## 2021-10-09 NOTE — Patient Instructions (Signed)

## 2021-10-09 NOTE — Progress Notes (Signed)
[facility]   Subjective:     Chief Complaint:  Chief Complaint  Patient presents with   [place] is a 66 y.o. female here for a pessary check. Pt said she has noticed she has a BM w/o realizing it.    History of Present Illness: Keiran Sias is a 66 y.o. female with stage III pelvic organ prolapse who presents for a pessary check. She is using a size 3 ring with support pessary. The pessary has been working well overall.  Sometimes when she is on her feet for long periods, she notices the bulge a little more, but not how it was without the pessary. Has seen a little blood with pessary removal but no other spotting. She is taking it out once a week.   Has been having some issues with bowel leakage. Happened twice where she noticed some loose stool. Has had some stress and feels that her IBS has been acting up. Has been trying to drink more water to prevent constipation.   Past Medical History: Patient  has a past medical history of Allergy, Arthritis, Febrile seizure ([place]), GERD (gastroesophageal reflux disease), Graves disease, History of migraine headaches, Hyperlipidemia, Hyperthyroidism, PONV (postoperative nausea and vomiting), Tobacco abuse, and Wears dentures.   Past Surgical History: She  has a past surgical history that includes Tonsilectomy, adenoidectomy, bilateral myringotomy and tubes; Tubal ligation; Knee surgery (Left); Colonoscopy with propofol (N/A, 12/21/2020); and polypectomy (N/A, 12/21/2020).   Medications: She has a current medication list which includes the following prescription(s): fexofenadine, hydrocortisone, methimazole, mometasone, ondansetron, valacyclovir, vitamin d (ergocalciferol), and [DISCONTINUED] albuterol.   Allergies: Patient is allergic to atenolol and codeine.   Social History: Patient  reports that she quit smoking about 10 years ago. Her smoking use included cigarettes. She has never used smokeless  tobacco. She reports that she does not currently use alcohol. She reports that she does not use drugs.      Objective:    Physical Exam: BP 131/78   Pulse 79  Gen: No apparent distress, A&O x 3. Detailed Urogynecologic Evaluation:  Pelvic Exam: Normal external female genitalia; Bartholin's and Skene's glands normal in appearance; urethral meatus normal in appearance, no urethral masses or discharge. Small amount of prolapse was noted around the pessary. The pessary was noted to be in place. It was removed and cleaned. Speculum exam revealed no lesions in the vagina. A #4 ring with support pessary was replaced (Lot [id number]). It was comfortable to the patient and fit well.   POP-Q:    POP-Q   0                                            Aa   0                                           Ba   2.5                                              C    4  Gh   3                                            Pb   7                                            tvl    -2                                            Ap   -2                                            Bp   -4                                              D          Assessment/Plan:    Assessment: Ms. Petzold is a 66 y.o. with stage II pelvic organ prolapse here for a pessary check. She is doing well.  Plan: - Fitted with #4 ring with support pessary. She will also keep the #3 ring in case. She will remove at least weekly.  - Advised to use fiber supplementation for stool bulking and to help prevent constipation/ straining.   Follow up 6 months or sooner if needed  Jaquita Folds, MD

## 2021-10-28 ENCOUNTER — Encounter: Payer: BC Managed Care – PPO | Admitting: Obstetrics and Gynecology

## 2021-11-26 ENCOUNTER — Encounter: Payer: Self-pay | Admitting: Family Medicine

## 2021-11-26 ENCOUNTER — Ambulatory Visit: Payer: BC Managed Care – PPO | Admitting: Family Medicine

## 2021-11-26 VITALS — BP 120/78 | HR 64 | Ht 62.0 in | Wt 125.0 lb

## 2021-11-26 DIAGNOSIS — J302 Other seasonal allergic rhinitis: Secondary | ICD-10-CM

## 2021-11-26 DIAGNOSIS — Z780 Asymptomatic menopausal state: Secondary | ICD-10-CM

## 2021-11-26 DIAGNOSIS — R69 Illness, unspecified: Secondary | ICD-10-CM | POA: Diagnosis not present

## 2021-11-26 DIAGNOSIS — B349 Viral infection, unspecified: Secondary | ICD-10-CM | POA: Diagnosis not present

## 2021-11-26 DIAGNOSIS — Z23 Encounter for immunization: Secondary | ICD-10-CM | POA: Diagnosis not present

## 2021-11-26 MED ORDER — MOMETASONE FUROATE 50 MCG/ACT NA SUSP: 2.0000 | Freq: Every day | NASAL | 5 refills | Status: DC | PRN

## 2021-11-26 MED ORDER — IPRATROPIUM BROMIDE 0.06 % NA SOLN: 2.0000 | Freq: Four times a day (QID) | NASAL | 12 refills | Status: DC

## 2021-11-26 MED ORDER — FEXOFENADINE HCL 180 MG PO TABS: 180.0000 mg | ORAL_TABLET | Freq: Every day | ORAL | 5 refills | Status: DC

## 2021-11-26 MED ORDER — VALACYCLOVIR HCL 1 G PO TABS: 1000.0000 mg | ORAL_TABLET | Freq: Every day | ORAL | 0 refills | Status: DC

## 2021-11-26 NOTE — Progress Notes (Signed)
Date:  11/26/2021   Name:  Lydia Carter   DOB:  [birth year]   MRN:  [id number]   Chief Complaint: Allergic Rhinitis , pneum vacc need, and Flu Vaccine  URI  Chronicity: allergic rhinitis. The current episode started in the past 7 days. The problem has been gradually improving. There has been no fever. Associated symptoms include congestion, a plugged ear sensation and sneezing. Pertinent negatives include no abdominal pain, chest pain, coughing, diarrhea, dysuria, ear pain, headaches, joint pain, joint swelling, nausea, neck pain, rash, rhinorrhea, sinus pain, sore throat, swollen glands, vomiting or wheezing.    Lab Results  Component Value Date   NA 140 08/24/2018   K 3.7 08/24/2018   CO2 26 08/24/2018   GLUCOSE 89 08/24/2018   BUN 5 (L) 08/24/2018   CREATININE 0.84 08/24/2018   CALCIUM 8.7 08/24/2018   Lab Results  Component Value Date   CHOL 180 03/09/2017   HDL 37.20 (L) 03/09/2017   LDLCALC 116 (H) 03/09/2017   LDLDIRECT 131.0 02/28/2016   TRIG 136.0 03/09/2017   CHOLHDL 5 03/09/2017   Lab Results  Component Value Date   TSH <0.01 (L) 01/12/2017   No results found for: "HGBA1C" Lab Results  Component Value Date   WBC 10.2 03/09/2017   HGB 12.7 03/09/2017   HCT 38.8 03/09/2017   MCV 87.6 03/09/2017   PLT 264.0 03/09/2017   Lab Results  Component Value Date   ALT 7 08/24/2018   AST 13 08/24/2018   ALKPHOS 85 08/24/2018   BILITOT 0.4 08/24/2018   Lab Results  Component Value Date   VD25OH 11.78 (L) 03/09/2017     Review of Systems  Constitutional:  Negative for chills and fever.  HENT:  Positive for congestion and sneezing. Negative for drooling, ear discharge, ear pain, rhinorrhea, sinus pain and sore throat.   Respiratory:  Negative for cough, shortness of breath and wheezing.   Cardiovascular:  Negative for chest pain, palpitations and leg swelling.  Gastrointestinal:  Negative for abdominal pain, blood in stool, constipation, diarrhea, nausea  and vomiting.  Endocrine: Negative for polydipsia.  Genitourinary:  Negative for dysuria, frequency, hematuria and urgency.  Musculoskeletal:  Negative for back pain, joint pain, myalgias and neck pain.  Skin:  Negative for rash.  Allergic/Immunologic: Negative for environmental allergies.  Neurological:  Negative for dizziness and headaches.  Hematological:  Does not bruise/bleed easily.  Psychiatric/Behavioral:  Negative for suicidal ideas. The patient is not nervous/anxious.     Patient Active Problem List   Diagnosis Date Noted   Rectal bleeding    Polyp of colon    Elevated blood pressure reading 04/15/2018   Vitamin D deficiency 12/18/2017   BCC (basal cell carcinoma) 09/07/2017   GERD (gastroesophageal reflux disease) 01/12/2017   HLD (hyperlipidemia) 01/12/2017   Hyperthyroidism 02/28/2016   Adjustment disorder with mixed anxiety and depressed mood 02/28/2016   Migraine 07/25/2014   Allergic rhinitis 07/25/2014   Arthritis 07/25/2014    Allergies  Allergen Reactions   Atenolol     Made her "emotional and cry"   Codeine Nausea And Vomiting    Past Surgical History:  Procedure Laterality Date   COLONOSCOPY WITH PROPOFOL N/A 12/21/2020   Procedure: COLONOSCOPY WITH BIOPSY;  Surgeon: Lucilla Lame, MD;  Location: [facility];  Service: Endoscopy;  Laterality: N/A;   KNEE SURGERY Left    laproscopic   POLYPECTOMY N/A 12/21/2020   Procedure: POLYPECTOMY;  Surgeon: Lucilla Lame, MD;  Location: [facility];  Service: Endoscopy;  Laterality: N/A;   TONSILECTOMY, ADENOIDECTOMY, BILATERAL MYRINGOTOMY AND TUBES     TUBAL LIGATION      Social History   Tobacco Use   Smoking status: Former    Types: Cigarettes    Quit date: 08/18/2011    Years since quitting: 10.2   Smokeless tobacco: Never  Vaping Use   Vaping Use: Never used  Substance Use Topics   Alcohol use: Not Currently   Drug use: No     Medication list has been reviewed and  updated.  Current Meds  Medication Sig   fexofenadine (ALLEGRA) 180 MG tablet Take 1 tablet (180 mg total) by mouth daily.   methimazole (TAPAZOLE) 10 MG tablet Take 10 mg by mouth daily.   mometasone (NASONEX) 50 MCG/ACT nasal spray Place 2 sprays into the nose daily as needed.   ondansetron (ZOFRAN-ODT) 8 MG disintegrating tablet Take 1 tablet (8 mg total) by mouth every 8 (eight) hours as needed for nausea.   valACYclovir (VALTREX) 1000 MG tablet Take 1 tablet (1,000 mg total) by mouth daily.   Vitamin D, Ergocalciferol, (DRISDOL) 1.25 MG (50000 UNIT) CAPS capsule Take 1 capsule (50,000 Units total) by mouth every 7 (seven) days.       11/26/2021   11:33 AM 04/02/2021    3:14 PM 09/28/2015    2:20 PM  GAD 7 : Generalized Anxiety Score  Nervous, Anxious, on Edge 0 0 3  Control/stop worrying 0 0 3  Worry too much - different things 0 0 3  Trouble relaxing 0 0 3  Restless 0 0 3  Easily annoyed or irritable 0 0 0  Afraid - awful might happen 0 0 1  Total GAD 7 Score 0 0 16  Anxiety Difficulty Not difficult at all Not difficult at all Somewhat difficult       11/26/2021   11:33 AM 04/02/2021    3:14 PM 08/24/2018    9:08 AM  Depression screen PHQ 2/9  Decreased Interest 0 0 0  Down, Depressed, Hopeless 0 0 0  PHQ - 2 Score 0 0 0  Altered sleeping 0 0   Tired, decreased energy 0 0   Change in appetite 0 0   Feeling bad or failure about yourself  0 0   Trouble concentrating 0 0   Moving slowly or fidgety/restless 0 0   Suicidal thoughts 0 0   PHQ-9 Score 0 0   Difficult doing work/chores Not difficult at all Not difficult at all     BP Readings from Last 3 Encounters:  11/26/21 120/78  10/09/21 131/78  09/03/21 130/73    Physical Exam Vitals and nursing note reviewed. Exam conducted with a chaperone present.  Constitutional:      General: She is not in acute distress.    Appearance: She is not diaphoretic.  HENT:     Head: Normocephalic and atraumatic.     Right  Ear: Tympanic membrane, ear canal and external ear normal.     Left Ear: Tympanic membrane, ear canal and external ear normal.     Nose: Nose normal. No congestion or rhinorrhea.     Mouth/Throat:     Mouth: Mucous membranes are moist.  Eyes:     General:        Right eye: No discharge.        Left eye: No discharge.     Conjunctiva/sclera: Conjunctivae normal.     Pupils: Pupils are equal, round, and  reactive to light.  Neck:     Thyroid: No thyromegaly.     Vascular: No JVD.  Cardiovascular:     Rate and Rhythm: Normal rate and regular rhythm.     Heart sounds: Normal heart sounds. No murmur heard.    No friction rub. No gallop.  Pulmonary:     Effort: Pulmonary effort is normal.     Breath sounds: Normal breath sounds. No wheezing, rhonchi or rales.  Abdominal:     General: Bowel sounds are normal.     Palpations: Abdomen is soft. There is no mass.     Tenderness: There is no abdominal tenderness. There is no guarding.  Musculoskeletal:        General: Normal range of motion.     Cervical back: Normal range of motion and neck supple.  Lymphadenopathy:     Cervical: No cervical adenopathy.  Skin:    General: Skin is warm and dry.  Neurological:     Mental Status: She is alert.     Wt Readings from Last 3 Encounters:  11/26/21 125 lb (56.7 kg)  07/29/21 119 lb (54 kg)  04/02/21 121 lb (54.9 kg)    BP 120/78   Pulse 64   Ht '5\' 2"'$  (1.575 m)   Wt 125 lb (56.7 kg)   BMI 22.86 kg/m   Assessment and Plan: 1. Seasonal allergic rhinitis, unspecified trigger Chronic.  Episodic.  Relatively stable with control with the following.  Continue fexofenadine 180 mg twice a day, Nasonex 2 sprays each nostril daily and Atrovent nasal spray 2 sprays both nostrils 4 times a day. - fexofenadine (ALLEGRA) 180 MG tablet; Take 1 tablet (180 mg total) by mouth daily.  Dispense: 30 tablet; Refill: 5 - mometasone (NASONEX) 50 MCG/ACT nasal spray; Place 2 sprays into the nose daily as  needed.  Dispense: 1 each; Refill: 5 - ipratropium (ATROVENT) 0.06 % nasal spray; Place 2 sprays into both nostrils 4 (four) times daily.  Dispense: 15 mL; Refill: 12  2. Viral syndrome Chronic.  Controlled.  Stable.  Continue on an as-needed basis. - valACYclovir (VALTREX) 1000 MG tablet; Take 1 tablet (1,000 mg total) by mouth daily.  Dispense: 30 tablet; Refill: 0  3. Postmenopausal Chronic.  Controlled.  Stable.  Continue lipid panel and will get bone density for osteoporosis surveillance. - Lipid Panel With LDL/HDL Ratio - DG Bone Density; Future  4. Taking medication for chronic disease Chronic.  Controlled.  Stable.  We will check lipid panel and comprehensive metabolic panel for levels. - Lipid Panel With LDL/HDL Ratio - Comprehensive Metabolic Panel (CMET)  5. Need for pneumococcal vaccination Discussed and administered. - Pneumococcal conjugate vaccine 20-valent (Prevnar 20)  6. Need for immunization against influenza Discussed and administered. - Flu Vaccine QUAD High Dose(Fluad)     Otilio Miu, MD

## 2021-12-09 ENCOUNTER — Encounter: Payer: BC Managed Care – PPO | Admitting: Obstetrics and Gynecology

## 2021-12-09 ENCOUNTER — Encounter: Payer: Self-pay | Admitting: *Deleted

## 2021-12-11 ENCOUNTER — Telehealth: Payer: Self-pay

## 2021-12-11 NOTE — Telephone Encounter (Signed)
Pt called back for [facility], states will FU later, she is sick today, cold and wants to stay in. Will get back later, will call if needs an appt.

## 2021-12-11 NOTE — Telephone Encounter (Signed)
Noted  KP 

## 2021-12-11 NOTE — Telephone Encounter (Signed)
Called pt to come by and get lab papers for labs that were ordered on 11/26/21

## 2021-12-17 ENCOUNTER — Encounter: Payer: Self-pay | Admitting: Family Medicine

## 2021-12-17 ENCOUNTER — Telehealth: Payer: Self-pay | Admitting: Family Medicine

## 2021-12-17 ENCOUNTER — Ambulatory Visit: Payer: BC Managed Care – PPO | Admitting: Family Medicine

## 2021-12-17 VITALS — BP 120/78 | HR 92 | Ht 62.0 in | Wt 125.0 lb

## 2021-12-17 DIAGNOSIS — J0100 Acute maxillary sinusitis, unspecified: Secondary | ICD-10-CM | POA: Diagnosis not present

## 2021-12-17 MED ORDER — AMOXICILLIN-POT CLAVULANATE 875-125 MG PO TABS: 1.0000 | ORAL_TABLET | Freq: Two times a day (BID) | ORAL | 0 refills | Status: DC

## 2021-12-17 NOTE — Progress Notes (Signed)
Date:  12/17/2021   Name:  Lydia Carter   DOB:  08/14/1955   MRN:  [id number]   Chief Complaint: Cough (Started with cough on 12/11/21- had 2 days of brown production. Now has drainage from nose. Has been taking mucinex- could not tell much difference)  Sinusitis This is a new problem. The current episode started in the past 7 days. The problem has been waxing and waning since onset. There has been no fever. The pain is moderate. Associated symptoms include chills, congestion, coughing, ear pain, headaches, shortness of breath, sinus pressure and sneezing. Pertinent negatives include no diaphoresis. Treatments tried: heat/mucinex.    Lab Results  Component Value Date   NA 140 08/24/2018   K 3.7 08/24/2018   CO2 26 08/24/2018   GLUCOSE 89 08/24/2018   BUN 5 (L) 08/24/2018   CREATININE 0.84 08/24/2018   CALCIUM 8.7 08/24/2018   Lab Results  Component Value Date   CHOL 180 03/09/2017   HDL 37.20 (L) 03/09/2017   LDLCALC 116 (H) 03/09/2017   LDLDIRECT 131.0 02/28/2016   TRIG 136.0 03/09/2017   CHOLHDL 5 03/09/2017   Lab Results  Component Value Date   TSH <0.01 (L) 01/12/2017   No results found for: "HGBA1C" Lab Results  Component Value Date   WBC 10.2 03/09/2017   HGB 12.7 03/09/2017   HCT 38.8 03/09/2017   MCV 87.6 03/09/2017   PLT 264.0 03/09/2017   Lab Results  Component Value Date   ALT 7 08/24/2018   AST 13 08/24/2018   ALKPHOS 85 08/24/2018   BILITOT 0.4 08/24/2018   Lab Results  Component Value Date   VD25OH 11.78 (L) 03/09/2017     Review of Systems  Constitutional:  Positive for chills. Negative for diaphoresis.  HENT:  Positive for congestion, ear pain, nosebleeds, postnasal drip, sinus pressure and sneezing.   Respiratory:  Positive for cough and shortness of breath. Negative for wheezing.   Cardiovascular:  Negative for chest pain, palpitations and leg swelling.  Gastrointestinal:  Negative for abdominal distention, abdominal pain, blood in  stool and constipation.  Neurological:  Positive for headaches.    Patient Active Problem List   Diagnosis Date Noted   Rectal bleeding    Polyp of colon    Elevated blood pressure reading 04/15/2018   Vitamin D deficiency 12/18/2017   BCC (basal cell carcinoma) 09/07/2017   GERD (gastroesophageal reflux disease) 01/12/2017   HLD (hyperlipidemia) 01/12/2017   Hyperthyroidism 02/28/2016   Adjustment disorder with mixed anxiety and depressed mood 02/28/2016   Migraine 07/25/2014   Allergic rhinitis 07/25/2014   Arthritis 07/25/2014    Allergies  Allergen Reactions   Atenolol     Made her "emotional and cry"   Codeine Nausea And Vomiting    Past Surgical History:  Procedure Laterality Date   COLONOSCOPY WITH PROPOFOL N/A 12/21/2020   Procedure: COLONOSCOPY WITH BIOPSY;  Surgeon: Lucilla Lame, MD;  Location: [place];  Service: Endoscopy;  Laterality: N/A;   KNEE SURGERY Left    laproscopic   POLYPECTOMY N/A 12/21/2020   Procedure: POLYPECTOMY;  Surgeon: Lucilla Lame, MD;  Location: [facility];  Service: Endoscopy;  Laterality: N/A;   TONSILECTOMY, ADENOIDECTOMY, BILATERAL MYRINGOTOMY AND TUBES     TUBAL LIGATION      Social History   Tobacco Use   Smoking status: Former    Types: Cigarettes    Quit date: 08/18/2011    Years since quitting: 10.3   Smokeless tobacco:  Never  Vaping Use   Vaping Use: Never used  Substance Use Topics   Alcohol use: Not Currently   Drug use: No     Medication list has been reviewed and updated.  Current Meds  Medication Sig   fexofenadine (ALLEGRA) 180 MG tablet Take 1 tablet (180 mg total) by mouth daily.   ipratropium (ATROVENT) 0.06 % nasal spray Place 2 sprays into both nostrils 4 (four) times daily.   methimazole (TAPAZOLE) 10 MG tablet Take 10 mg by mouth daily.   mometasone (NASONEX) 50 MCG/ACT nasal spray Place 2 sprays into the nose daily as needed.   ondansetron (ZOFRAN-ODT) 8 MG disintegrating tablet  Take 1 tablet (8 mg total) by mouth every 8 (eight) hours as needed for nausea.   valACYclovir (VALTREX) 1000 MG tablet Take 1 tablet (1,000 mg total) by mouth daily.   Vitamin D, Ergocalciferol, (DRISDOL) 1.25 MG (50000 UNIT) CAPS capsule Take 1 capsule (50,000 Units total) by mouth every 7 (seven) days.       12/17/2021   11:00 AM 11/26/2021   11:33 AM 04/02/2021    3:14 PM 09/28/2015    2:20 PM  GAD 7 : Generalized Anxiety Score  Nervous, Anxious, on Edge 0 0 0 3  Control/stop worrying 0 0 0 3  Worry too much - different things 0 0 0 3  Trouble relaxing 0 0 0 3  Restless 0 0 0 3  Easily annoyed or irritable 0 0 0 0  Afraid - awful might happen 0 0 0 1  Total GAD 7 Score 0 0 0 16  Anxiety Difficulty Not difficult at all Not difficult at all Not difficult at all Somewhat difficult       12/17/2021   11:00 AM 11/26/2021   11:33 AM 04/02/2021    3:14 PM  Depression screen PHQ 2/9  Decreased Interest 0 0 0  Down, Depressed, Hopeless 0 0 0  PHQ - 2 Score 0 0 0  Altered sleeping 0 0 0  Tired, decreased energy 0 0 0  Change in appetite 0 0 0  Feeling bad or failure about yourself  0 0 0  Trouble concentrating 0 0 0  Moving slowly or fidgety/restless 0 0 0  Suicidal thoughts 0 0 0  PHQ-9 Score 0 0 0  Difficult doing work/chores Not difficult at all Not difficult at all Not difficult at all    BP Readings from Last 3 Encounters:  12/17/21 110/80  11/26/21 120/78  10/09/21 131/78    Physical Exam Vitals and nursing note reviewed.  HENT:     Right Ear: Tympanic membrane, ear canal and external ear normal.     Left Ear: Tympanic membrane, ear canal and external ear normal.     Nose:     Right Sinus: Maxillary sinus tenderness and frontal sinus tenderness present.     Left Sinus: Maxillary sinus tenderness and frontal sinus tenderness present.     Mouth/Throat:     Mouth: Mucous membranes are moist.     Pharynx: Oropharynx is clear.  Eyes:     Pupils: Pupils are equal,  round, and reactive to light.  Pulmonary:     Effort: Pulmonary effort is normal.  Musculoskeletal:     Cervical back: Normal range of motion.     Wt Readings from Last 3 Encounters:  12/17/21 125 lb (56.7 kg)  11/26/21 125 lb (56.7 kg)  07/29/21 119 lb (54 kg)    BP 110/80   Pulse  92   Ht '5\' 2"'$  (1.575 m)   Wt 125 lb (56.7 kg)   SpO2 98%   BMI 22.86 kg/m   Assessment and Plan: 1. Acute maxillary sinusitis, recurrence not specified New onset.  Persistent.  Patient has an exam consistent with sinusitis with tenderness over the maxillary and frontal sinuses bilateral.  Patient's been instructed to use nasal saline spray to lavage and add a nasal steroid for opening up passages.  We will treat infection with Augmentin 875 mg twice a day for 10 days. - amoxicillin-clavulanate (AUGMENTIN) 875-125 MG tablet; Take 1 tablet by mouth 2 (two) times daily.  Dispense: 20 tablet; Refill: 0     Otilio Miu, MD

## 2021-12-17 NOTE — Telephone Encounter (Signed)
Copied from [place] [id number]. Topic: General - Inquiry >> Dec 17, 2021  8:03 AM Lydia Carter wrote: Pt called to advise she is still not better, her nose is bloody inside from dryness, still has cough, and it has been over a week. pt made a mychart visit, however, if Dr Ronnald Ramp needs to see her in person, please let her know.  She will do whatever the dr wants. Marland Kitchen

## 2022-01-06 ENCOUNTER — Ambulatory Visit: Payer: Self-pay | Admitting: *Deleted

## 2022-01-06 ENCOUNTER — Encounter: Payer: Self-pay | Admitting: Family Medicine

## 2022-01-06 ENCOUNTER — Ambulatory Visit (INDEPENDENT_AMBULATORY_CARE_PROVIDER_SITE_OTHER): Payer: BC Managed Care – PPO | Admitting: Family Medicine

## 2022-01-06 VITALS — BP 142/82 | HR 90 | Ht 62.0 in | Wt 122.0 lb

## 2022-01-06 DIAGNOSIS — R1909 Other intra-abdominal and pelvic swelling, mass and lump: Secondary | ICD-10-CM | POA: Diagnosis not present

## 2022-01-06 LAB — POCT URINALYSIS DIPSTICK
Bilirubin, UA: NEGATIVE
Blood, UA: NEGATIVE
Glucose, UA: NEGATIVE
Ketones, UA: NEGATIVE
Leukocytes, UA: NEGATIVE
Nitrite, UA: NEGATIVE
Protein, UA: NEGATIVE
Spec Grav, UA: <=1.005 — AB (ref 1.010–1.025)
Urobilinogen, UA: 0.2 E.U./dL
pH, UA: 6 (ref 5.0–8.0)

## 2022-01-06 MED ORDER — CYCLOBENZAPRINE HCL 5 MG PO TABS: 5.0000 mg | ORAL_TABLET | Freq: Three times a day (TID) | ORAL | 1 refills | Status: DC | PRN

## 2022-01-06 NOTE — Telephone Encounter (Signed)
Pt has an appt today.  KP

## 2022-01-06 NOTE — Assessment & Plan Note (Signed)
Left inguinal mass since 01/04/2022, has been coughing heavily for comorbid URI and felt pain to the low back with radiation to the left groin, denies any radiation distally, no paresthesias. Noted a lump at her left groin that fluctuates in size, is painful, aggravated by straining. Denies any skin color change such as bruising. No dysuria, constipated at baseline with no change.  Examination with negative SLR, there are normoactive bowel sounds, no masses in the stomach quadrants, iliopsoas testing with pain to the groin, adductor resisted testing with pain to the groin, there is a palpable tender, soft 4x4 cm mass at the left inguinal region that is not pulsatile, reducible.   Concern for inguinal hernia, stat ultrasound ordered and activity restrictions provided. Placed urgent referral to general surgeon, can trial PRN muscle relaxer, and guidelines for ER follow-up discussed with patient and her daughter, they vocalized understanding.

## 2022-01-06 NOTE — Progress Notes (Signed)
     Primary Care / Sports Medicine Office Visit  Patient Information:  Patient ID: Lydia Carter, female DOB: 1956-02-13 Age: 66 y.o. MRN: [id number]   Egan Sahlin is a pleasant 66 y.o. female presenting with the following:  Chief Complaint  Patient presents with   Groin Pain    Left side with mass. Does see urogyn, called them today was told to come here to be checked for hernia     Vitals:   01/06/22 1453 01/06/22 1459  BP: (!) 142/78 (!) 142/82  Pulse: 90   SpO2: 98%    Vitals:   01/06/22 1453  Weight: 122 lb (55.3 kg)  Height: '5\' 2"'$  (1.575 m)   Body mass index is 22.31 kg/m.  No results found.   Independent interpretation of notes and tests performed by another provider:   None  Procedures performed:   None  Pertinent History, Exam, Impression, and Recommendations:   Problem List Items Addressed This Visit       Other   Mass of left inguinal region - Primary    Left inguinal mass since 01/04/2022, has been coughing heavily for comorbid URI and felt pain to the low back with radiation to the left groin, denies any radiation distally, no paresthesias. Noted a lump at her left groin that fluctuates in size, is painful, aggravated by straining. Denies any skin color change such as bruising. No dysuria, constipated at baseline with no change.  Examination with negative SLR, there are normoactive bowel sounds, no masses in the stomach quadrants, iliopsoas testing with pain to the groin, adductor resisted testing with pain to the groin, there is a palpable tender, soft 4x4 cm mass at the left inguinal region that is not pulsatile, reducible.   Concern for inguinal hernia, stat ultrasound ordered and activity restrictions provided. Placed urgent referral to general surgeon, can trial PRN muscle relaxer, and guidelines for ER follow-up discussed with patient and her daughter, they vocalized understanding.      Relevant Orders   POCT urinalysis dipstick  (Completed)   [place] LT LOWER EXTREM LTD SOFT TISSUE NON VASCULAR   Ambulatory referral to General Surgery     Orders & Medications Meds ordered this encounter  Medications   cyclobenzaprine (FLEXERIL) 5 MG tablet    Sig: Take 1 tablet (5 mg total) by mouth 3 (three) times daily as needed for muscle spasms.    Dispense:  30 tablet    Refill:  1   Orders Placed This Encounter  Procedures   [place] LT LOWER EXTREM LTD SOFT TISSUE NON VASCULAR   Ambulatory referral to General Surgery   POCT urinalysis dipstick     No follow-ups on file.     Montel Culver, MD   Primary Care Sports Medicine [place]

## 2022-01-06 NOTE — Telephone Encounter (Signed)
Pt called again to see if she has been worked in with Dr. Ronnald Ramp.  Please follow up with pt.

## 2022-01-06 NOTE — Telephone Encounter (Signed)
Reason for Disposition  [1] MILD-MODERATE pain AND [2] constant AND [3] present > 2 hours    Left hard inguinal hernia that has a lot of pain  Answer Assessment - Initial Assessment Questions 1. LOCATION: "Where does it hurt?"      I woke up on Sat. With a hernia.   I can't lay down.   I talked with my on call dr. With uro gyn over the weekend.   She gave me the name of the surgeon she would refer me too to share with Dr. Ronnald Ramp. It's on left groin.    2. RADIATION: "Does the pain shoot anywhere else?" (e.g., chest, back)     Lower back on left side and a little down my left leg.   3. ONSET: "When did the pain begin?" (e.g., minutes, hours or days ago)      Saturday  It's hard feeling.    It does not go away. 4. SUDDEN: "Gradual or sudden onset?"     *No Answer* 5. PATTERN "Does the pain come and go, or is it constant?"    - If it comes and goes: "How long does it last?" "Do you have pain now?"     (Note: Comes and goes means the pain is intermittent. It goes away completely between bouts.)    - If constant: "Is it getting better, staying the same, or getting worse?"      (Note: Constant means the pain never goes away completely; most serious pain is constant and gets worse.)      A lot of pain 6. SEVERITY: "How bad is the pain?"  (e.g., Scale 1-10; mild, moderate, or severe)    - MILD (1-3): Doesn't interfere with normal activities, abdomen soft and not tender to touch.     - MODERATE (4-7): Interferes with normal activities or awakens from sleep, abdomen tender to touch.     - SEVERE (8-10): Excruciating pain, doubled over, unable to do any normal activities.       Burning 7. RECURRENT SYMPTOM: "Have you ever had this type of stomach pain before?" If Yes, ask: "When was the last time?" and "What happened that time?"      No 8. CAUSE: "What do you think is causing the stomach pain?"     Hernia left groin 9. RELIEVING/AGGRAVATING FACTORS: "What makes it better or worse?" (e.g., antacids,  bending or twisting motion, bowel movement)     Coughing, sneezing.    I've been coughing a lot with this illness I saw Dr. Ronnald Ramp about. 10. OTHER SYMPTOMS: "Do you have any other symptoms?" (e.g., back pain, diarrhea, fever, urination pain, vomiting)       I'm coughing a lot.   I'm wondering if that caused the hernia. 11. PREGNANCY: "Is there any chance you are pregnant?" "When was your last menstrual period?"       N/A due to age  Protocols used: Abdominal Pain - [facility]

## 2022-01-06 NOTE — Telephone Encounter (Signed)
  Chief Complaint: Left groin pain  spoke with Uro-Gyn dr. Over the weekend and was given instructions for going to the ED if needed.   That dr. Is sending a message to Dr. Ronnald Ramp. Symptoms: Hard knot that is very painful in her left groin that she woke up with on Sat. Morning.   She is unable to reduce it or lay down on her back due to the pain. Frequency: Since Sat. Morning when she woke up.   She has been sick recently and is still struggling with a lot of coughing. Pertinent Negatives: Patient denies diarrhea, vomiting or nausea. Disposition: '[]'$ ED /'[]'$ Urgent Care (no appt availability in office) / '[]'$ Appointment(In office/virtual)/ '[]'$  [facility]/ '[]'$ Home Care/ '[]'$ Refused Recommended Disposition /'[]'$ [place] [facility]/ '[x]'$  Follow-up with PCP Additional Notes: There are no appts today with Dr. Otilio Miu so message is being sent to see if they can work her in since it's a hard knot that she is unable to reduce.   Pt. Agreeable to someone calling her back.

## 2022-01-06 NOTE — Patient Instructions (Signed)
-   Proceed with ultrasound - Gentle daily tasks as tolerated - If symptoms worsen, go to ER - Can trial muscle relaxer as-needed - Scheduler will contact you to coordinate visit with surgeon

## 2022-01-07 ENCOUNTER — Telehealth: Payer: Self-pay | Admitting: Family Medicine

## 2022-01-07 ENCOUNTER — Ambulatory Visit
Admission: RE | Admit: 2022-01-07 | Discharge: 2022-01-07 | Disposition: A | Discharge status: Home / Self Care | Payer: BC Managed Care – PPO | Source: Ambulatory Visit | Attending: Family Medicine | Admitting: Family Medicine

## 2022-01-07 DIAGNOSIS — R1909 Other intra-abdominal and pelvic swelling, mass and lump: Secondary | ICD-10-CM | POA: Diagnosis present

## 2022-01-08 NOTE — Telephone Encounter (Signed)
Informed pt of results and referral. Pt voiced understanding. Will follow up with Dr. Ronnald Ramp with any questions or concerns.

## 2022-04-07 ENCOUNTER — Ambulatory Visit: Payer: Self-pay

## 2022-04-07 NOTE — Telephone Encounter (Signed)
Message from Lydia Carter sent at 04/07/2022 11:06 AM EST  Summary: COVID / Temperature greater then 100   Patient unsure what medication she can take for a stuffy nose. Patient has a temperature greater then 100 and she has been taking Tylenol. Patient took a home COVID test on 04/06/2022 - positive.         Chief Complaint: sinus congestion yellow drainage Symptoms: sore throat, fever Frequency: sx started yesterday Pertinent Negatives: Patient denies SOB, chest pain, cough Disposition: []$ ED /[]$ Urgent Care (no appt availability in office) / [x]$ Appointment(In office/virtual)/ []$  [facility]/ []$ Home Care/ []$ Refused Recommended Disposition /[]$ [facility]/ []$  Follow-up with PCP Additional Notes: VV appt with PCP tomorrow  Reason for Disposition  [1] COVID-19 diagnosed by positive lab test (e.g., PCR, rapid self-test kit) AND [2] NO symptoms (e.g., cough, fever, others)  Answer Assessment - Initial Assessment Questions 1. COVID-19 DIAGNOSIS: "How do you know that you have COVID?" (e.g., positive lab test or self-test, diagnosed by doctor or NP/PA, symptoms after exposure).     Self tested positive 2. COVID-19 EXPOSURE: "Was there any known exposure to COVID before the symptoms began?" CDC Definition of close contact: within 6 feet (2 meters) for a total of 15 minutes or more over a 24-hour period.      husband 3. ONSET: "When did the COVID-19 symptoms start?"      yesterday 4. WORST SYMPTOM: "What is your worst symptom?" (e.g., cough, fever, shortness of breath, muscle aches)     Sinus pressure  low grade fever 5. COUGH: "Do you have a cough?" If Yes, ask: "How bad is the cough?"       no 6. FEVER: "Do you have a fever?" If Yes, ask: "What is your temperature, how was it measured, and when did it start?"     100 7. RESPIRATORY STATUS: "Describe your breathing?" (e.g., normal; shortness of breath, wheezing, unable to speak)      normal 8. BETTER-SAME-WORSE: "Are  you getting better, staying the same or getting worse compared to yesterday?"  If getting worse, ask, "In what way?"     same 9. OTHER SYMPTOMS: "Do you have any other symptoms?"  (e.g., chills, fatigue, headache, loss of smell or taste, muscle pain, sore throat)     Sore throat  10. HIGH RISK DISEASE: "Do you have any chronic medical problems?" (e.g., asthma, heart or lung disease, weak immune system, obesity, etc.)       [place] 11. VACCINE: "Have you had the COVID-19 vaccine?" If Yes, ask: "Which one, how many shots, when did you get it?"       Yes x 4  12. PREGNANCY: "Is there any chance you are pregnant?" "When was your last menstrual period?"       N/a 13. O2 SATURATION MONITOR:  "Do you use an oxygen saturation monitor (pulse oximeter) at home?" If Yes, ask "What is your reading (oxygen level) today?" "What is your usual oxygen saturation reading?" (e.g., 95%)       N/a  Protocols used: Coronavirus (COVID-19) Diagnosed or Suspected-[facility]

## 2022-04-08 ENCOUNTER — Ambulatory Visit: Payer: BC Managed Care – PPO | Admitting: Family Medicine

## 2022-04-08 ENCOUNTER — Encounter: Payer: Self-pay | Admitting: Family Medicine

## 2022-04-08 VITALS — BP 130/72 | HR 87 | Temp 98.3°F | Ht 62.0 in | Wt 120.0 lb

## 2022-04-08 DIAGNOSIS — J0100 Acute maxillary sinusitis, unspecified: Secondary | ICD-10-CM | POA: Diagnosis not present

## 2022-04-08 DIAGNOSIS — U071 COVID-19: Secondary | ICD-10-CM

## 2022-04-08 MED ORDER — AZITHROMYCIN 250 MG PO TABS: ORAL_TABLET | ORAL | 0 refills | Status: AC

## 2022-04-08 NOTE — Patient Instructions (Signed)

## 2022-04-08 NOTE — Progress Notes (Signed)
Date:  04/08/2022   Name:  Lydia Carter   DOB:  01-08-1956   MRN:  [id number]   Chief Complaint: Covid Positive (Symptoms started Sunday and she tested positive that same day- fever, sinus congestion- draining down throat)  Fever  This is a new problem. The problem has been gradually improving. The maximum temperature noted was 99 to 99.9 F. Associated symptoms include congestion and coughing. Pertinent negatives include no chest pain or wheezing. Associated symptoms comments: Purulent nasal discharge. She has tried acetaminophen for the symptoms. The treatment provided mild relief.  Sinusitis This is a new problem. The current episode started in the past 7 days. The problem has been waxing and waning since onset. Maximum temperature: low grade. Associated symptoms include chills, congestion and coughing. Pertinent negatives include no shortness of breath. Past treatments include saline nose sprays.    Lab Results  Component Value Date   NA 140 08/24/2018   K 3.7 08/24/2018   CO2 26 08/24/2018   GLUCOSE 89 08/24/2018   BUN 5 (L) 08/24/2018   CREATININE 0.84 08/24/2018   CALCIUM 8.7 08/24/2018   Lab Results  Component Value Date   CHOL 180 03/09/2017   HDL 37.20 (L) 03/09/2017   LDLCALC 116 (H) 03/09/2017   LDLDIRECT 131.0 02/28/2016   TRIG 136.0 03/09/2017   CHOLHDL 5 03/09/2017   Lab Results  Component Value Date   TSH <0.01 (L) 01/12/2017   No results found for: "HGBA1C" Lab Results  Component Value Date   WBC 10.2 03/09/2017   HGB 12.7 03/09/2017   HCT 38.8 03/09/2017   MCV 87.6 03/09/2017   PLT 264.0 03/09/2017   Lab Results  Component Value Date   ALT 7 08/24/2018   AST 13 08/24/2018   ALKPHOS 85 08/24/2018   BILITOT 0.4 08/24/2018   Lab Results  Component Value Date   VD25OH 11.78 (L) 03/09/2017     Review of Systems  Constitutional:  Positive for chills and fever.  HENT:  Positive for congestion.   Respiratory:  Positive for cough. Negative  for chest tightness, shortness of breath and wheezing.   Cardiovascular:  Negative for chest pain.    Patient Active Problem List   Diagnosis Date Noted   Mass of left inguinal region 01/06/2022   Rectal bleeding    Polyp of colon    Elevated blood pressure reading 04/15/2018   Vitamin D deficiency 12/18/2017   BCC (basal cell carcinoma) 09/07/2017   GERD (gastroesophageal reflux disease) 01/12/2017   HLD (hyperlipidemia) 01/12/2017   Hyperthyroidism 02/28/2016   Adjustment disorder with mixed anxiety and depressed mood 02/28/2016   Migraine 07/25/2014   Allergic rhinitis 07/25/2014   Arthritis 07/25/2014    Allergies  Allergen Reactions   Atenolol     Made her "emotional and cry"   Codeine Nausea And Vomiting    Past Surgical History:  Procedure Laterality Date   COLONOSCOPY WITH PROPOFOL N/A 12/21/2020   Procedure: COLONOSCOPY WITH BIOPSY;  Surgeon: Lucilla Lame, MD;  Location: [facility];  Service: Endoscopy;  Laterality: N/A;   KNEE SURGERY Left    laproscopic   POLYPECTOMY N/A 12/21/2020   Procedure: POLYPECTOMY;  Surgeon: Lucilla Lame, MD;  Location: [facility];  Service: Endoscopy;  Laterality: N/A;   TONSILECTOMY, ADENOIDECTOMY, BILATERAL MYRINGOTOMY AND TUBES     TUBAL LIGATION      Social History   Tobacco Use   Smoking status: Former    Types: Cigarettes  Quit date: 08/18/2011    Years since quitting: 10.6   Smokeless tobacco: Never  Vaping Use   Vaping Use: Never used  Substance Use Topics   Alcohol use: Not Currently   Drug use: No     Medication list has been reviewed and updated.  Current Meds  Medication Sig   fexofenadine (ALLEGRA) 180 MG tablet Take 1 tablet (180 mg total) by mouth daily.   ipratropium (ATROVENT) 0.06 % nasal spray Place 2 sprays into both nostrils 4 (four) times daily.   methimazole (TAPAZOLE) 10 MG tablet Take 10 mg by mouth daily.   mometasone (NASONEX) 50 MCG/ACT nasal spray Place 2 sprays into the  nose daily as needed.   ondansetron (ZOFRAN-ODT) 8 MG disintegrating tablet Take 1 tablet (8 mg total) by mouth every 8 (eight) hours as needed for nausea.   valACYclovir (VALTREX) 1000 MG tablet Take 1 tablet (1,000 mg total) by mouth daily.   Vitamin D, Ergocalciferol, (DRISDOL) 1.25 MG (50000 UNIT) CAPS capsule Take 1 capsule (50,000 Units total) by mouth every 7 (seven) days.       04/08/2022   11:30 AM 12/17/2021   11:00 AM 11/26/2021   11:33 AM 04/02/2021    3:14 PM  GAD 7 : Generalized Anxiety Score  Nervous, Anxious, on Edge 0 0 0 0  Control/stop worrying 0 0 0 0  Worry too much - different things 0 0 0 0  Trouble relaxing 0 0 0 0  Restless 0 0 0 0  Easily annoyed or irritable 0 0 0 0  Afraid - awful might happen 0 0 0 0  Total GAD 7 Score 0 0 0 0  Anxiety Difficulty Not difficult at all Not difficult at all Not difficult at all Not difficult at all       04/08/2022   11:30 AM 12/17/2021   11:00 AM 11/26/2021   11:33 AM  Depression screen PHQ 2/9  Decreased Interest 0 0 0  Down, Depressed, Hopeless 0 0 0  PHQ - 2 Score 0 0 0  Altered sleeping 0 0 0  Tired, decreased energy 0 0 0  Change in appetite 0 0 0  Feeling bad or failure about yourself  0 0 0  Trouble concentrating 0 0 0  Moving slowly or fidgety/restless 0 0 0  Suicidal thoughts 0 0 0  PHQ-9 Score 0 0 0  Difficult doing work/chores Not difficult at all Not difficult at all Not difficult at all    BP Readings from Last 3 Encounters:  04/08/22 130/72  01/06/22 (!) 142/82  12/17/21 120/78    Physical Exam HENT:     Right Ear: Tympanic membrane and ear canal normal.     Left Ear: Tympanic membrane and ear canal normal.     Nose: Nose normal.     Mouth/Throat:     Mouth: Mucous membranes are moist.  Cardiovascular:     Rate and Rhythm: Normal rate.     Heart sounds: No murmur heard.    No gallop.  Pulmonary:     Breath sounds: No wheezing, rhonchi or rales.  Neurological:     Mental Status: She  is alert.     Wt Readings from Last 3 Encounters:  04/08/22 120 lb (54.4 kg)  01/06/22 122 lb (55.3 kg)  12/17/21 125 lb (56.7 kg)    BP 130/72   Pulse 87   Temp 98.3 F (36.8 C) (Oral)   Ht 5' 2"$  (1.575 m)  Wt 120 lb (54.4 kg)   SpO2 97%   BMI 21.95 kg/m   Assessment and Plan:  1. Acute maxillary sinusitis, recurrence not specified New onset.  Persistent.  Stable.  Purulent nasal discharge has developed in the last 48 hours and exam is consistent with early sinusitis.  Will treat with azithromycin - azithromycin (ZITHROMAX) 250 MG tablet; Take 2 tablets on day 1, then 1 tablet daily on days 2 through 5  Dispense: 6 tablet; Refill: 0  2. COVID-19 New onset.  Persistent.  Gradually improving.  Patient seems to feel that she got this from her husband may have gotten this from church.  There is a cough but it is controlled with Mucinex DM and I have encouraged her to continue this as well as maintaining hydration and acetaminophen for fever chills maintenance.  Patient has been encouraged to consume an adequate diet as well.  Patient does not wish to be on Paxlovid or molnupiravir and that availability is difficult at this time and that she is beyond time.  That would probably be necessary and is gradually improving at this time anyway.   Otilio Miu, MD

## 2022-04-16 ENCOUNTER — Ambulatory Visit: Payer: BC Managed Care – PPO | Admitting: Obstetrics and Gynecology

## 2022-05-09 ENCOUNTER — Ambulatory Visit: Payer: BC Managed Care – PPO | Admitting: Family Medicine

## 2022-05-09 ENCOUNTER — Encounter: Payer: Self-pay | Admitting: Family Medicine

## 2022-05-09 VITALS — BP 128/78 | HR 73 | Ht 62.0 in | Wt 120.0 lb

## 2022-05-09 DIAGNOSIS — M7918 Myalgia, other site: Secondary | ICD-10-CM

## 2022-05-09 DIAGNOSIS — Z1231 Encounter for screening mammogram for malignant neoplasm of breast: Secondary | ICD-10-CM

## 2022-05-09 MED ORDER — MELOXICAM 7.5 MG PO TABS: 7.5000 mg | ORAL_TABLET | Freq: Every day | ORAL | 1 refills | Status: DC

## 2022-05-09 NOTE — Progress Notes (Signed)
Date:  05/09/2022   Name:  Lydia Carter   DOB:  1955-04-07   MRN:  [id number]   Chief Complaint: Breast Pain (Right, arm pit whole breast, morning is worst. Has had infection prior, has been using right side more than usual. Does have red spot)  Chest Pain  This is a new problem. The current episode started in the past 7 days. The onset quality is sudden. The problem occurs constantly. The problem has been waxing and waning. The pain is present in the lateral region (involves area of right breast). The pain is at a severity of 3/10. The pain is moderate. The quality of the pain is described as dull (ache). The pain radiates to the right shoulder. Pertinent negatives include no abdominal pain, back pain, cough, dizziness, exertional chest pressure, fever, headaches, irregular heartbeat, nausea, numbness, palpitations, shortness of breath or weakness. Associated with: upon awakening. She has tried nothing for the symptoms.    Lab Results  Component Value Date   NA 140 08/24/2018   K 3.7 08/24/2018   CO2 26 08/24/2018   GLUCOSE 89 08/24/2018   BUN 5 (L) 08/24/2018   CREATININE 0.84 08/24/2018   CALCIUM 8.7 08/24/2018   Lab Results  Component Value Date   CHOL 180 03/09/2017   HDL 37.20 (L) 03/09/2017   LDLCALC 116 (H) 03/09/2017   LDLDIRECT 131.0 02/28/2016   TRIG 136.0 03/09/2017   CHOLHDL 5 03/09/2017   Lab Results  Component Value Date   TSH <0.01 (L) 01/12/2017   No results found for: "HGBA1C" Lab Results  Component Value Date   WBC 10.2 03/09/2017   HGB 12.7 03/09/2017   HCT 38.8 03/09/2017   MCV 87.6 03/09/2017   PLT 264.0 03/09/2017   Lab Results  Component Value Date   ALT 7 08/24/2018   AST 13 08/24/2018   ALKPHOS 85 08/24/2018   BILITOT 0.4 08/24/2018   Lab Results  Component Value Date   VD25OH 11.78 (L) 03/09/2017     Review of Systems  Constitutional: Negative.  Negative for chills, fatigue, fever and unexpected weight change.  HENT:  Negative  for congestion, ear discharge, ear pain, rhinorrhea, sinus pressure, sneezing and sore throat.   Respiratory:  Negative for cough, shortness of breath, wheezing and stridor.   Cardiovascular:  Positive for chest pain. Negative for palpitations.  Gastrointestinal:  Negative for abdominal pain, blood in stool, constipation, diarrhea and nausea.  Genitourinary:  Negative for dysuria, flank pain, frequency, hematuria, urgency and vaginal discharge.  Musculoskeletal:  Negative for arthralgias, back pain and myalgias.  Skin:  Negative for rash.  Neurological:  Negative for dizziness, weakness, numbness and headaches.  Hematological:  Negative for adenopathy. Does not bruise/bleed easily.  Psychiatric/Behavioral:  Negative for dysphoric mood. The patient is not nervous/anxious.     Patient Active Problem List   Diagnosis Date Noted   Mass of left inguinal region 01/06/2022   Rectal bleeding    Polyp of colon    Elevated blood pressure reading 04/15/2018   Vitamin D deficiency 12/18/2017   BCC (basal cell carcinoma) 09/07/2017   GERD (gastroesophageal reflux disease) 01/12/2017   HLD (hyperlipidemia) 01/12/2017   Hyperthyroidism 02/28/2016   Adjustment disorder with mixed anxiety and depressed mood 02/28/2016   Migraine 07/25/2014   Allergic rhinitis 07/25/2014   Arthritis 07/25/2014    Allergies  Allergen Reactions   Atenolol     Made her "emotional and cry"   Codeine Nausea And Vomiting  Past Surgical History:  Procedure Laterality Date   COLONOSCOPY WITH PROPOFOL N/A 12/21/2020   Procedure: COLONOSCOPY WITH BIOPSY;  Surgeon: Lucilla Lame, MD;  Location: [facility];  Service: Endoscopy;  Laterality: N/A;   KNEE SURGERY Left    laproscopic   POLYPECTOMY N/A 12/21/2020   Procedure: POLYPECTOMY;  Surgeon: Lucilla Lame, MD;  Location: [facility];  Service: Endoscopy;  Laterality: N/A;   TONSILECTOMY, ADENOIDECTOMY, BILATERAL MYRINGOTOMY AND TUBES     TUBAL  LIGATION      Social History   Tobacco Use   Smoking status: Former    Types: Cigarettes    Quit date: 08/18/2011    Years since quitting: 10.7   Smokeless tobacco: Never  Vaping Use   Vaping Use: Never used  Substance Use Topics   Alcohol use: Not Currently   Drug use: No     Medication list has been reviewed and updated.  Current Meds  Medication Sig   fexofenadine (ALLEGRA) 180 MG tablet Take 1 tablet (180 mg total) by mouth daily.   ipratropium (ATROVENT) 0.06 % nasal spray Place 2 sprays into both nostrils 4 (four) times daily.   methimazole (TAPAZOLE) 10 MG tablet Take 10 mg by mouth daily.   mometasone (NASONEX) 50 MCG/ACT nasal spray Place 2 sprays into the nose daily as needed.   ondansetron (ZOFRAN-ODT) 8 MG disintegrating tablet Take 1 tablet (8 mg total) by mouth every 8 (eight) hours as needed for nausea.   valACYclovir (VALTREX) 1000 MG tablet Take 1 tablet (1,000 mg total) by mouth daily.   Vitamin D, Ergocalciferol, (DRISDOL) 1.25 MG (50000 UNIT) CAPS capsule Take 1 capsule (50,000 Units total) by mouth every 7 (seven) days.       05/09/2022   10:23 AM 04/08/2022   11:30 AM 12/17/2021   11:00 AM 11/26/2021   11:33 AM  GAD 7 : Generalized Anxiety Score  Nervous, Anxious, on Edge 3 0 0 0  Control/stop worrying 2 0 0 0  Worry too much - different things 2 0 0 0  Trouble relaxing 2 0 0 0  Restless 2 0 0 0  Easily annoyed or irritable 2 0 0 0  Afraid - awful might happen 2 0 0 0  Total GAD 7 Score 15 0 0 0  Anxiety Difficulty Somewhat difficult Not difficult at all Not difficult at all Not difficult at all       05/09/2022   10:23 AM 04/08/2022   11:30 AM 12/17/2021   11:00 AM  Depression screen PHQ 2/9  Decreased Interest 0 0 0  Down, Depressed, Hopeless 0 0 0  PHQ - 2 Score 0 0 0  Altered sleeping 0 0 0  Tired, decreased energy 0 0 0  Change in appetite 0 0 0  Feeling bad or failure about yourself  0 0 0  Trouble concentrating 0 0 0  Moving  slowly or fidgety/restless 0 0 0  Suicidal thoughts 0 0 0  PHQ-9 Score 0 0 0  Difficult doing work/chores Somewhat difficult Not difficult at all Not difficult at all    BP Readings from Last 3 Encounters:  05/09/22 128/78  04/08/22 130/72  01/06/22 (!) 142/82    Physical Exam Vitals and nursing note reviewed. Exam conducted with a chaperone present.  Constitutional:      General: She is not in acute distress.    Appearance: She is not diaphoretic.  HENT:     Head: Normocephalic and atraumatic.  Right Ear: External ear normal.     Left Ear: External ear normal.     Nose: Nose normal.  Eyes:     General:        Right eye: No discharge.        Left eye: No discharge.     Conjunctiva/sclera: Conjunctivae normal.     Pupils: Pupils are equal, round, and reactive to light.  Neck:     Thyroid: No thyromegaly.     Vascular: No JVD.  Cardiovascular:     Rate and Rhythm: Normal rate and regular rhythm.     Heart sounds: Normal heart sounds. No murmur heard.    No friction rub. No gallop.  Pulmonary:     Effort: Pulmonary effort is normal.     Breath sounds: Normal breath sounds.  Chest:     Chest wall: Tenderness present.  Breasts:    Right: No swelling, bleeding, inverted nipple, mass, nipple discharge, skin change or tenderness.     Left: No swelling, bleeding, inverted nipple, mass, nipple discharge, skin change or tenderness.     Comments: Breast tissue aside/tenderness of multiple intercostal spaces.  There was no tenderness of the actual breast tissue. Abdominal:     General: Bowel sounds are normal.     Palpations: Abdomen is soft. There is no mass.     Tenderness: There is no abdominal tenderness. There is no guarding.  Musculoskeletal:     Cervical back: Normal range of motion and neck supple.  Lymphadenopathy:     Cervical: No cervical adenopathy.  Skin:    General: Skin is warm.  Neurological:     Mental Status: She is alert.     Deep Tendon Reflexes:  Reflexes are normal and symmetric.     Wt Readings from Last 3 Encounters:  05/09/22 120 lb (54.4 kg)  04/08/22 120 lb (54.4 kg)  01/06/22 122 lb (55.3 kg)    BP 128/78   Pulse 73   Ht 5\' 2"  (1.575 m)   Wt 120 lb (54.4 kg)   SpO2 98%   BMI 21.95 kg/m   Assessment and Plan:  1. Intercostal muscle pain New onset.  Persistent.  Stable.  There is tenderness of the intercostal spaces underneath the breast tissue consistent with chest wall pain.  In questioning patient the night of this starting she was scrubbing with an cleaning eraser and sustained probably an injury to the pectoral and intercostal muscles.  We will initiate meloxicam 7.5 mg once a day and patient is also been encouraged to take Tylenol during the day if needs to have supplementation.  Patient has been invited to come back if there is any further pain or nonresolution. - meloxicam (MOBIC) 7.5 MG tablet; Take 1 tablet (7.5 mg total) by mouth daily.  Dispense: 30 tablet; Refill: 1  2. Encounter for screening mammogram for malignant neoplasm of breast Discussed with patient breast exam was normal we will proceed with screening mammogram of both breasts. - MM 3D SCREENING MAMMOGRAM BILATERAL BREAST    Otilio Miu, MD

## 2022-05-14 ENCOUNTER — Ambulatory Visit: Payer: BC Managed Care – PPO | Admitting: Obstetrics and Gynecology

## 2022-05-20 ENCOUNTER — Telehealth: Payer: Self-pay | Admitting: Family Medicine

## 2022-05-20 NOTE — Telephone Encounter (Signed)
Copied from [facility] [id number]. Topic: General - Other >> May 20, 2022  4:46 PM Ja-Kwan M wrote: Reason for CRM: Pt reports that the earliest appt at the breast center is 06/09/22 so she would like to know if the appt with Dr. Ronnald Ramp should be rescheduled until after the appt at the breast center. Pt requests that Baxter Flattery return her call at [phone]

## 2022-05-28 ENCOUNTER — Ambulatory Visit: Payer: BC Managed Care – PPO | Admitting: Family Medicine

## 2022-06-09 ENCOUNTER — Ambulatory Visit
Admission: RE | Admit: 2022-06-09 | Discharge: 2022-06-09 | Disposition: A | Discharge status: Home / Self Care | Payer: BC Managed Care – PPO | Source: Ambulatory Visit | Attending: Family Medicine | Admitting: Family Medicine

## 2022-06-09 ENCOUNTER — Ambulatory Visit: Payer: BC Managed Care – PPO | Admitting: Family Medicine

## 2022-06-09 ENCOUNTER — Encounter: Payer: Self-pay | Admitting: Family Medicine

## 2022-06-09 VITALS — BP 128/78 | HR 88 | Ht 62.0 in | Wt 121.0 lb

## 2022-06-09 DIAGNOSIS — G43B0 Ophthalmoplegic migraine, not intractable: Secondary | ICD-10-CM

## 2022-06-09 DIAGNOSIS — E559 Vitamin D deficiency, unspecified: Secondary | ICD-10-CM | POA: Diagnosis not present

## 2022-06-09 DIAGNOSIS — B3789 Other sites of candidiasis: Secondary | ICD-10-CM

## 2022-06-09 DIAGNOSIS — J302 Other seasonal allergic rhinitis: Secondary | ICD-10-CM | POA: Diagnosis not present

## 2022-06-09 DIAGNOSIS — B349 Viral infection, unspecified: Secondary | ICD-10-CM

## 2022-06-09 DIAGNOSIS — Z1231 Encounter for screening mammogram for malignant neoplasm of breast: Secondary | ICD-10-CM | POA: Insufficient documentation

## 2022-06-09 MED ORDER — ONDANSETRON 8 MG PO TBDP: 8.0000 mg | ORAL_TABLET | Freq: Three times a day (TID) | ORAL | 0 refills | Status: AC | PRN

## 2022-06-09 MED ORDER — IPRATROPIUM BROMIDE 0.06 % NA SOLN: 2.0000 | Freq: Four times a day (QID) | NASAL | 12 refills | Status: AC

## 2022-06-09 MED ORDER — VALACYCLOVIR HCL 1 G PO TABS: 1000.0000 mg | ORAL_TABLET | Freq: Every day | ORAL | 0 refills | Status: AC

## 2022-06-09 MED ORDER — MOMETASONE FUROATE 50 MCG/ACT NA SUSP: 2.0000 | Freq: Every day | NASAL | 5 refills | Status: DC | PRN

## 2022-06-09 MED ORDER — NYSTATIN 100000 UNIT/GM EX CREA: 1.0000 | TOPICAL_CREAM | Freq: Two times a day (BID) | CUTANEOUS | 0 refills | Status: DC

## 2022-06-09 MED ORDER — VITAMIN D (ERGOCALCIFEROL) 1.25 MG (50000 UNIT) PO CAPS: 50000.0000 [IU] | ORAL_CAPSULE | ORAL | 5 refills | Status: DC

## 2022-06-09 MED ORDER — FEXOFENADINE HCL 180 MG PO TABS: 180.0000 mg | ORAL_TABLET | Freq: Every day | ORAL | 5 refills | Status: AC

## 2022-06-09 NOTE — Progress Notes (Signed)
Date:  06/09/2022   Name:  Lydia Carter   DOB:  11/29/1955   MRN:  [id number]   Chief Complaint: vitamin d deficiency, Nausea (From migraines), Allergic Rhinitis , Mouth Lesions, and Rash (Gets yeast under breasts)  Mouth Lesions  The current episode started more than 1 week ago. The problem occurs continuously. The problem has been gradually improving. The problem is moderate. Associated symptoms include mouth sores and rash. Pertinent negatives include no fever, no photophobia, no diarrhea, no congestion, no rhinorrhea, no sore throat, no cough, no wheezing, no eye discharge and no eye pain.  Rash This is a chronic problem. The current episode started more than 1 month ago. The problem has been waxing and waning since onset. The affected locations include the chest. The rash is characterized by blistering and redness. She was exposed to nothing. Pertinent negatives include no congestion, cough, diarrhea, eye pain, fatigue, fever, rhinorrhea, shortness of breath or sore throat. Past treatments include nothing. There is no history of asthma.    Lab Results  Component Value Date   NA 140 08/24/2018   K 3.7 08/24/2018   CO2 26 08/24/2018   GLUCOSE 89 08/24/2018   BUN 5 (L) 08/24/2018   CREATININE 0.84 08/24/2018   CALCIUM 8.7 08/24/2018   Lab Results  Component Value Date   CHOL 180 03/09/2017   HDL 37.20 (L) 03/09/2017   LDLCALC 116 (H) 03/09/2017   LDLDIRECT 131.0 02/28/2016   TRIG 136.0 03/09/2017   CHOLHDL 5 03/09/2017   Lab Results  Component Value Date   TSH <0.01 (L) 01/12/2017   No results found for: "HGBA1C" Lab Results  Component Value Date   WBC 10.2 03/09/2017   HGB 12.7 03/09/2017   HCT 38.8 03/09/2017   MCV 87.6 03/09/2017   PLT 264.0 03/09/2017   Lab Results  Component Value Date   ALT 7 08/24/2018   AST 13 08/24/2018   ALKPHOS 85 08/24/2018   BILITOT 0.4 08/24/2018   Lab Results  Component Value Date   VD25OH 11.78 (L) 03/09/2017      Review of Systems  Constitutional:  Negative for fatigue and fever.  HENT:  Positive for mouth sores. Negative for congestion, rhinorrhea, sore throat and trouble swallowing.   Eyes:  Negative for photophobia, pain and discharge.  Respiratory:  Negative for cough, shortness of breath and wheezing.   Cardiovascular:  Negative for chest pain and palpitations.  Gastrointestinal:  Negative for blood in stool and diarrhea.  Endocrine: Negative for polydipsia and polyuria.  Genitourinary:  Negative for flank pain and frequency.  Musculoskeletal:  Negative for back pain.  Skin:  Positive for rash.    Patient Active Problem List   Diagnosis Date Noted   Mass of left inguinal region 01/06/2022   Rectal bleeding    Polyp of colon    Elevated blood pressure reading 04/15/2018   Vitamin D deficiency 12/18/2017   BCC (basal cell carcinoma) 09/07/2017   GERD (gastroesophageal reflux disease) 01/12/2017   HLD (hyperlipidemia) 01/12/2017   Hyperthyroidism 02/28/2016   Adjustment disorder with mixed anxiety and depressed mood 02/28/2016   Migraine 07/25/2014   Allergic rhinitis 07/25/2014   Arthritis 07/25/2014    Allergies  Allergen Reactions   Atenolol     Made her "emotional and cry"   Codeine Nausea And Vomiting    Past Surgical History:  Procedure Laterality Date   COLONOSCOPY WITH PROPOFOL N/A 12/21/2020   Procedure: COLONOSCOPY WITH BIOPSY;  Surgeon: Midge Minium,  MD;  Location: [facility];  Service: Endoscopy;  Laterality: N/A;   KNEE SURGERY Left    laproscopic   POLYPECTOMY N/A 12/21/2020   Procedure: POLYPECTOMY;  Surgeon: Midge Minium, MD;  Location: [facility];  Service: Endoscopy;  Laterality: N/A;   TONSILECTOMY, ADENOIDECTOMY, BILATERAL MYRINGOTOMY AND TUBES     TUBAL LIGATION      Social History   Tobacco Use   Smoking status: Former    Types: Cigarettes    Quit date: 08/18/2011    Years since quitting: 10.8   Smokeless tobacco: Never  Vaping  Use   Vaping Use: Never used  Substance Use Topics   Alcohol use: Not Currently   Drug use: No     Medication list has been reviewed and updated.  Current Meds  Medication Sig   fexofenadine (ALLEGRA) 180 MG tablet Take 1 tablet (180 mg total) by mouth daily.   ipratropium (ATROVENT) 0.06 % nasal spray Place 2 sprays into both nostrils 4 (four) times daily.   meloxicam (MOBIC) 7.5 MG tablet Take 1 tablet (7.5 mg total) by mouth daily.   methimazole (TAPAZOLE) 10 MG tablet Take 10 mg by mouth daily.   mometasone (NASONEX) 50 MCG/ACT nasal spray Place 2 sprays into the nose daily as needed.   ondansetron (ZOFRAN-ODT) 8 MG disintegrating tablet Take 1 tablet (8 mg total) by mouth every 8 (eight) hours as needed for nausea.   valACYclovir (VALTREX) 1000 MG tablet Take 1 tablet (1,000 mg total) by mouth daily.   Vitamin D, Ergocalciferol, (DRISDOL) 1.25 MG (50000 UNIT) CAPS capsule Take 1 capsule (50,000 Units total) by mouth every 7 (seven) days.       06/09/2022    3:14 PM 05/09/2022   10:23 AM 04/08/2022   11:30 AM 12/17/2021   11:00 AM  GAD 7 : Generalized Anxiety Score  Nervous, Anxious, on Edge 0 3 0 0  Control/stop worrying 0 2 0 0  Worry too much - different things 0 2 0 0  Trouble relaxing 0 2 0 0  Restless 0 2 0 0  Easily annoyed or irritable 0 2 0 0  Afraid - awful might happen 0 2 0 0  Total GAD 7 Score 0 15 0 0  Anxiety Difficulty Not difficult at all Somewhat difficult Not difficult at all Not difficult at all       06/09/2022    3:13 PM 05/09/2022   10:23 AM 04/08/2022   11:30 AM  Depression screen PHQ 2/9  Decreased Interest 0 0 0  Down, Depressed, Hopeless 0 0 0  PHQ - 2 Score 0 0 0  Altered sleeping 0 0 0  Tired, decreased energy 0 0 0  Change in appetite 0 0 0  Feeling bad or failure about yourself  0 0 0  Trouble concentrating 0 0 0  Moving slowly or fidgety/restless 0 0 0  Suicidal thoughts 0 0 0  PHQ-9 Score 0 0 0  Difficult doing work/chores Not  difficult at all Somewhat difficult Not difficult at all    BP Readings from Last 3 Encounters:  06/09/22 128/78  05/09/22 128/78  04/08/22 130/72    Physical Exam Vitals and nursing note reviewed. Exam conducted with a chaperone present.  Constitutional:      General: She is not in acute distress.    Appearance: She is not diaphoretic.  HENT:     Head: Normocephalic and atraumatic.     Right Ear: Tympanic membrane, ear canal  and external ear normal. There is no impacted cerumen.     Left Ear: Tympanic membrane, ear canal and external ear normal. There is no impacted cerumen.     Nose: Nose normal. No congestion or rhinorrhea.     Mouth/Throat:     Mouth: Mucous membranes are moist.  Eyes:     General:        Right eye: No discharge.        Left eye: No discharge.     Conjunctiva/sclera: Conjunctivae normal.     Pupils: Pupils are equal, round, and reactive to light.  Neck:     Thyroid: No thyromegaly.     Vascular: No JVD.  Cardiovascular:     Rate and Rhythm: Normal rate and regular rhythm.     Heart sounds: Normal heart sounds. No murmur heard.    No friction rub. No gallop.  Pulmonary:     Effort: Pulmonary effort is normal.     Breath sounds: Normal breath sounds. No wheezing, rhonchi or rales.  Chest:     Chest wall: No tenderness.  Abdominal:     General: Bowel sounds are normal.     Palpations: Abdomen is soft. There is no mass.     Tenderness: There is no abdominal tenderness. There is no guarding or rebound.  Musculoskeletal:        General: Normal range of motion.     Cervical back: Normal range of motion and neck supple.  Lymphadenopathy:     Cervical: No cervical adenopathy.  Skin:    General: Skin is warm and dry.     Findings: Erythema present.  Neurological:     Mental Status: She is alert.     Deep Tendon Reflexes: Reflexes are normal and symmetric.     Wt Readings from Last 3 Encounters:  06/09/22 121 lb (54.9 kg)  05/09/22 120 lb (54.4 kg)   04/08/22 120 lb (54.4 kg)    BP 128/78   Pulse 88   Ht  (1.575 m)   Wt 121 lb (54.9 kg)   SpO2 97%   BMI 22.13 kg/m   Assessment and Plan:  1. Seasonal allergic rhinitis, unspecified trigger Chronic.  Controlled.  Stable.  Continue trio of fexofenadine 180 mg once a day Atrovent nasal spray 2 sprays both nostril 4 times a day and alternate with Nasonex 50 mg nasal spray 2 sprays nostril daily. - fexofenadine (ALLEGRA) 180 MG tablet; Take 1 tablet (180 mg total) by mouth daily.  Dispense: 30 tablet; Refill: 5 - ipratropium (ATROVENT) 0.06 % nasal spray; Place 2 sprays into both nostrils 4 (four) times daily.  Dispense: 15 mL; Refill: 12 - mometasone (NASONEX) 50 MCG/ACT nasal spray; Place 2 sprays into the nose daily as needed.  Dispense: 1 each; Refill: 5  2. 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  Controlled.  Stable.  Continue Zofran on as-needed basis as needed migraine headache. - ondansetron (ZOFRAN-ODT) 8 MG disintegrating tablet; Take 1 tablet (8 mg total) by mouth every 8 (eight) hours as needed for nausea.  Dispense: 20 tablet; Refill: 0  3. Viral syndrome Chronic.  Controlled.  Stable.  As needed basis 1 g daily as needed viral syndrome. - valACYclovir (VALTREX) 1000 MG tablet; Take 1 tablet (1,000 mg total) by mouth daily.  Dispense: 30 tablet; Refill: 0  4. Vitamin D deficiency Chronic.  Controlled.  Stable.  Continue vitamin D 50,000 unit capsule 1 capsule every 7 days. - Vitamin D, Ergocalciferol, (DRISDOL) 1.25  MG (50000 UNIT) CAPS capsule; Take 1 capsule (50,000 Units total) by mouth every 7 (seven) days.  Dispense: 5 capsule; Refill: 5  5. Candidiasis of breast New onset.  Episodic.  Rash beneath the breasts consistent with candidiasis.  Will treat on a as needed basis nystatin cream apply twice a day. - nystatin cream (MYCOSTATIN); Apply 1 Application topically 2 (two) times daily.  Dispense: 30 g; Refill: 0    Elizabeth Sauer, MD

## 2022-08-28 ENCOUNTER — Telehealth: Payer: Self-pay

## 2022-08-28 NOTE — Transitions of Care (Post Inpatient/ED Visit) (Signed)
08/28/2022  Name: Lydia Carter MRN: [id number] DOB: [birth year]  Today's TOC FU Call Status: Today's TOC FU Call Status:: Successful TOC FU Call Competed TOC FU Call Complete Date: 08/28/22  Transition Care Management Follow-up Telephone Call Date of Discharge: 08/27/22 Discharge Facility: Other ([facility]) Name of Other ([facility]) Discharge Facility: [facility] Type of Discharge: Inpatient Admission Primary Inpatient Discharge Diagnosis:: hernia How have you been since you were released from the hospital?: Better Any questions or concerns?: No  Items Reviewed: Did you receive and understand the discharge instructions provided?: Yes Medications obtained,verified, and reconciled?: Yes (Medications Reviewed) Any new allergies since your discharge?: No Dietary orders reviewed?: Yes Do you have support at home?: Yes People in Home: spouse  Medications Reviewed Today: Medications Reviewed Today     Reviewed by Karena Addison, LPN (Licensed Practical Nurse) on 08/28/22 at [phone]  Med List Status: <None>   Medication Order Taking? Sig Documenting Provider Last Dose Status Informant  acetaminophen (TYLENOL) 500 MG tablet [id number] Yes Take 1,000 mg by mouth every 8 (eight) hours as needed. [provider]  Active      Discontinued 11/27/19 1055   Docusate Sodium (DSS) 100 MG CAPS [id number] Yes Take 100 capsules by mouth daily. [provider]  Active   fexofenadine (ALLEGRA) 180 MG tablet [id number]  Take 1 tablet (180 mg total) by mouth daily. Duanne Limerick, MD  Active   ipratropium (ATROVENT) 0.06 % nasal spray [id number]  Place 2 sprays into both nostrils 4 (four) times daily. Duanne Limerick, MD  Active   lidocaine 4 % [id number] Yes Place 1 patch onto the skin daily. [provider]  Active   meloxicam (MOBIC) 7.5 MG tablet [id number]  Take 1 tablet (7.5 mg total) by mouth daily. Duanne Limerick, MD  Active   methimazole (TAPAZOLE) 10 MG tablet  [id number]  Take 10 mg by mouth daily. [provider]  Active   methocarbamol (ROBAXIN) 500 MG tablet [id number] Yes Take 500 mg by mouth every 6 (six) hours as needed. [provider]  Active   mometasone (NASONEX) 50 MCG/ACT nasal spray [id number]  Place 2 sprays into the nose daily as needed. Duanne Limerick, MD  Active   nystatin cream (MYCOSTATIN) [id number]  Apply 1 Application topically 2 (two) times daily. Duanne Limerick, MD  Active   ondansetron (ZOFRAN-ODT) 8 MG disintegrating tablet [id number]  Take 1 tablet (8 mg total) by mouth every 8 (eight) hours as needed for nausea. Duanne Limerick, MD  Active   oxyCODONE (OXY IR/ROXICODONE) 5 MG immediate release tablet [id number] Yes Take 5 mg by mouth every 6 (six) hours as needed. [provider]  Active   pregabalin (LYRICA) 25 MG capsule [id number] Yes Take 25 mg by mouth 3 (three) times daily. [provider]  Active   valACYclovir (VALTREX) 1000 MG tablet [id number]  Take 1 tablet (1,000 mg total) by mouth daily. Duanne Limerick, MD  Active   Vitamin D, Ergocalciferol, (DRISDOL) 1.25 MG (50000 UNIT) CAPS capsule [id number]  Take 1 capsule (50,000 Units total) by mouth every 7 (seven) days. Duanne Limerick, MD  Active             Home Care and Equipment/Supplies: Were Home Health Services Ordered?: NA Any new equipment or medical supplies ordered?: NA  Functional Questionnaire: Do you need assistance with bathing/showering or dressing?: No Do you need assistance with meal preparation?: No Do you need assistance with eating?: No  Do you have difficulty maintaining continence: No Do you need assistance with getting out of bed/getting out of a chair/moving?: No Do you have difficulty managing or taking your medications?: No  Follow up appointments reviewed: PCP Follow-up appointment confirmed?: Yes Date of PCP follow-up appointment?: 09/04/22 Follow-up Provider: Yetta Barre Do you need transportation to your  follow-up appointment?: No Do you understand care options if your condition(s) worsen?: Yes-patient verbalized understanding    SIGNATURE Karena Addison, LPN [facility] Nurse Health Advisor Direct Dial [phone]

## 2022-09-04 ENCOUNTER — Encounter: Payer: Self-pay | Admitting: Family Medicine

## 2022-09-04 ENCOUNTER — Ambulatory Visit: Payer: BC Managed Care – PPO | Admitting: Family Medicine

## 2022-09-04 VITALS — BP 120/70 | HR 94 | Ht 62.0 in | Wt 121.0 lb

## 2022-09-04 DIAGNOSIS — E7800 Pure hypercholesterolemia, unspecified: Secondary | ICD-10-CM

## 2022-09-04 DIAGNOSIS — I700 Atherosclerosis of aorta: Secondary | ICD-10-CM | POA: Diagnosis not present

## 2022-09-04 DIAGNOSIS — R7989 Other specified abnormal findings of blood chemistry: Secondary | ICD-10-CM | POA: Diagnosis not present

## 2022-09-04 NOTE — Progress Notes (Unsigned)
Date:  09/04/2022   Name:  Lydia Carter   DOB:  07-12-1955   MRN:  [id number]   Chief Complaint: No chief complaint on file.  Follow up Hospitalization  Patient was admitted to [facility] on 08/25/2022 and discharged on 08/27/2022. She was treated for left direct inguinal hernia strangulated with colonic fat pad. Treatment for this included repair of inguinal hernia/exploratory lap. Telephone follow up was done on 08/28/2022 She reports good compliance with treatment. She reports this condition is improved.  ----------------------------------------------------------------------------------------- -         Lab Results  Component Value Date   NA 140 08/24/2018   K 3.7 08/24/2018   CO2 26 08/24/2018   GLUCOSE 89 08/24/2018   BUN 5 (L) 08/24/2018   CREATININE 0.84 08/24/2018   CALCIUM 8.7 08/24/2018   Lab Results  Component Value Date   CHOL 180 03/09/2017   HDL 37.20 (L) 03/09/2017   LDLCALC 116 (H) 03/09/2017   LDLDIRECT 131.0 02/28/2016   TRIG 136.0 03/09/2017   CHOLHDL 5 03/09/2017   Lab Results  Component Value Date   TSH <0.01 (L) 01/12/2017   No results found for: "HGBA1C" Lab Results  Component Value Date   WBC 10.2 03/09/2017   HGB 12.7 03/09/2017   HCT 38.8 03/09/2017   MCV 87.6 03/09/2017   PLT 264.0 03/09/2017   Lab Results  Component Value Date   ALT 7 08/24/2018   AST 13 08/24/2018   ALKPHOS 85 08/24/2018   BILITOT 0.4 08/24/2018   Lab Results  Component Value Date   VD25OH 11.78 (L) 03/09/2017     Review of Systems  Constitutional: Negative.  Negative for chills, fatigue, fever and unexpected weight change.  HENT:  Negative for congestion, ear discharge, ear pain, rhinorrhea, sinus pressure, sneezing and sore throat.   Respiratory:  Negative for cough, shortness of breath, wheezing and stridor.   Gastrointestinal:  Negative for abdominal pain, blood in stool, constipation, diarrhea and nausea.   Genitourinary:  Negative for dysuria, flank pain, frequency, hematuria, urgency and vaginal discharge.  Musculoskeletal:  Negative for arthralgias, back pain and myalgias.  Skin:  Negative for rash.  Neurological:  Negative for dizziness, weakness and headaches.  Hematological:  Negative for adenopathy. Does not bruise/bleed easily.  Psychiatric/Behavioral:  Negative for dysphoric mood. The patient is not nervous/anxious.     Patient Active Problem List   Diagnosis Date Noted   Mass of left inguinal region 01/06/2022   Rectal bleeding    Polyp of colon    Elevated blood pressure reading 04/15/2018   Vitamin D deficiency 12/18/2017   BCC (basal cell carcinoma) 09/07/2017   GERD (gastroesophageal reflux disease) 01/12/2017   HLD (hyperlipidemia) 01/12/2017   Hyperthyroidism 02/28/2016   Adjustment disorder with mixed anxiety and depressed mood 02/28/2016   Migraine 07/25/2014   Allergic rhinitis 07/25/2014   Arthritis 07/25/2014    Allergies  Allergen Reactions   Atenolol     Made her "emotional and cry"   Codeine Nausea And Vomiting    Past Surgical History:  Procedure Laterality Date   COLONOSCOPY WITH PROPOFOL N/A 12/21/2020   Procedure: COLONOSCOPY WITH BIOPSY;  Surgeon: Midge Minium, MD;  Location: [facility];  Service: Endoscopy;  Laterality: N/A;   KNEE SURGERY Left    laproscopic   POLYPECTOMY N/A 12/21/2020   Procedure: POLYPECTOMY;  Surgeon: Midge Minium, MD;  Location: [facility];  Service: Endoscopy;  Laterality: N/A;   TONSILECTOMY, ADENOIDECTOMY,  BILATERAL MYRINGOTOMY AND TUBES     TUBAL LIGATION      Social History   Tobacco Use   Smoking status: Former    Current packs/day: 0.00    Types: Cigarettes    Quit date: 08/18/2011    Years since quitting: 11.0   Smokeless tobacco: Never  Vaping Use   Vaping status: Never Used  Substance Use Topics   Alcohol use: Not Currently   Drug use: No     Medication list has been reviewed and  updated.  No outpatient medications have been marked as taking for the 09/04/22 encounter (Appointment) with Duanne Limerick, MD.       06/09/2022    3:14 PM 05/09/2022   10:23 AM 04/08/2022   11:30 AM 12/17/2021   11:00 AM  GAD 7 : Generalized Anxiety Score  Nervous, Anxious, on Edge 0 3 0 0  Control/stop worrying 0 2 0 0  Worry too much - different things 0 2 0 0  Trouble relaxing 0 2 0 0  Restless 0 2 0 0  Easily annoyed or irritable 0 2 0 0  Afraid - awful might happen 0 2 0 0  Total GAD 7 Score 0 15 0 0  Anxiety Difficulty Not difficult at all Somewhat difficult Not difficult at all Not difficult at all       06/09/2022    3:13 PM 05/09/2022   10:23 AM 04/08/2022   11:30 AM  Depression screen PHQ 2/9  Decreased Interest 0 0 0  Down, Depressed, Hopeless 0 0 0  PHQ - 2 Score 0 0 0  Altered sleeping 0 0 0  Tired, decreased energy 0 0 0  Change in appetite 0 0 0  Feeling bad or failure about yourself  0 0 0  Trouble concentrating 0 0 0  Moving slowly or fidgety/restless 0 0 0  Suicidal thoughts 0 0 0  PHQ-9 Score 0 0 0  Difficult doing work/chores Not difficult at all Somewhat difficult Not difficult at all    BP Readings from Last 3 Encounters:  06/09/22 128/78  05/09/22 128/78  04/08/22 130/72    Physical Exam Vitals and nursing note reviewed.  Constitutional:      Appearance: She is well-developed.  HENT:     Head: Normocephalic.     Right Ear: Tympanic membrane and external ear normal.     Left Ear: Tympanic membrane and external ear normal.     Nose: Nose normal.     Mouth/Throat:     Mouth: Mucous membranes are moist.  Eyes:     General: Lids are everted, no foreign bodies appreciated. No scleral icterus.       Left eye: No foreign body or hordeolum.     Conjunctiva/sclera: Conjunctivae normal.     Right eye: Right conjunctiva is not injected.     Left eye: Left conjunctiva is not injected.     Pupils: Pupils are equal, round, and reactive to light.   Neck:     Thyroid: No thyromegaly.     Vascular: No JVD.     Trachea: No tracheal deviation.  Cardiovascular:     Rate and Rhythm: Normal rate and regular rhythm.     Heart sounds: Normal heart sounds. No murmur heard.    No friction rub. No gallop.  Pulmonary:     Effort: Pulmonary effort is normal. No respiratory distress.     Breath sounds: Normal breath sounds. No wheezing or rales.  Abdominal:  General: Bowel sounds are normal.     Palpations: Abdomen is soft. There is no mass.     Tenderness: There is no abdominal tenderness. There is no guarding or rebound.  Musculoskeletal:        General: No tenderness. Normal range of motion.     Cervical back: Normal range of motion and neck supple.  Lymphadenopathy:     Cervical: No cervical adenopathy.  Skin:    General: Skin is warm.     Findings: No rash.  Neurological:     Mental Status: She is alert and oriented to person, place, and time.     Cranial Nerves: No cranial nerve deficit.     Deep Tendon Reflexes: Reflexes normal.  Psychiatric:        Mood and Affect: Mood is not anxious or depressed.     Wt Readings from Last 3 Encounters:  06/09/22 121 lb (54.9 kg)  05/09/22 120 lb (54.4 kg)  04/08/22 120 lb (54.4 kg)    [facility]                                   Transitional Care Clinic   [facility] Discharge Acute Issues Care Follow Up                                                                        Patient Demographics  Canisha Issac, is a 67 y.o. female  DOB 1955-08-26  MRN [id number].  Primary MD  Duanne Limerick, MD   Reason for TCC follow Up -follow-up 2 reassess creatinine BUN   Past Medical History:  Diagnosis Date   Allergy    Arthritis    Febrile seizure (HCC)    childhood   GERD (gastroesophageal reflux disease)    Graves disease    History of migraine headaches    Hyperlipidemia     Hyperthyroidism    PONV (postoperative nausea and vomiting)    Tobacco abuse    Wears dentures    full upper    Past Surgical History:  Procedure Laterality Date   COLONOSCOPY WITH PROPOFOL N/A 12/21/2020   Procedure: COLONOSCOPY WITH BIOPSY;  Surgeon: Midge Minium, MD;  Location: [facility];  Service: Endoscopy;  Laterality: N/A;   KNEE SURGERY Left    laproscopic   POLYPECTOMY N/A 12/21/2020   Procedure: POLYPECTOMY;  Surgeon: Midge Minium, MD;  Location: [facility];  Service: Endoscopy;  Laterality: N/A;   TONSILECTOMY, ADENOIDECTOMY, BILATERAL MYRINGOTOMY AND TUBES     TUBAL LIGATION     Hoisp HPI and HOSP course S/p inguinal repair doing well/ staple to be rremoved by surgeon Post hosp care and rechecl lipid/BUN and creatinine       Subjective:   [place] today has, No headache, No chest pain, No abdominal pain - No Nausea, No new weakness tingling or numbness, No Cough - SOB.  No fever or chills.  No abdominal discomfort.  Examination of wound area is doing well and patient is having to have staples removed upon reevaluation of her surgical  site 2 weeks from discharge.      Objective:   Vitals:   09/04/22 1541  BP: 120/70  Pulse: 94  SpO2: 98%  Weight: 121 lb (54.9 kg)  Height: 5\' 2"  (1.575 m)    Wt Readings from Last 3 Encounters:  09/04/22 121 lb (54.9 kg)  06/09/22 121 lb (54.9 kg)  05/09/22 120 lb (54.4 kg)    Allergies as of 09/04/2022       Reactions   Atenolol    Made her "emotional and cry"   Codeine Nausea And Vomiting        Medication List        Accurate as of September 04, 2022  4:21 PM. If you have any questions, ask your nurse or doctor.          acetaminophen 500 MG tablet Commonly known as: TYLENOL Take 1,000 mg by mouth every 8 (eight) hours as needed.   fexofenadine 180 MG tablet Commonly known as: ALLEGRA Take 1 tablet (180 mg total) by mouth daily.   ipratropium 0.06 % nasal spray Commonly known  as: ATROVENT Place 2 sprays into both nostrils 4 (four) times daily.   lidocaine 4 % Place 1 patch onto the skin daily.   meloxicam 7.5 MG tablet Commonly known as: MOBIC Take 1 tablet (7.5 mg total) by mouth daily.   methimazole 10 MG tablet Commonly known as: TAPAZOLE Take 10 mg by mouth daily.   mometasone 50 MCG/ACT nasal spray Commonly known as: NASONEX Place 2 sprays into the nose daily as needed.   nystatin cream Commonly known as: MYCOSTATIN Apply 1 Application topically 2 (two) times daily.   ondansetron 8 MG disintegrating tablet Commonly known as: ZOFRAN-ODT Take 1 tablet (8 mg total) by mouth every 8 (eight) hours as needed for nausea.   oxyCODONE 5 MG immediate release tablet Commonly known as: Oxy IR/ROXICODONE Take 5 mg by mouth every 6 (six) hours as needed.   valACYclovir 1000 MG tablet Commonly known as: VALTREX Take 1 tablet (1,000 mg total) by mouth daily.   Vitamin D (Ergocalciferol) 1.25 MG (50000 UNIT) Caps capsule Commonly known as: DRISDOL Take 1 capsule (50,000 Units total) by mouth every 7 (seven) days.         Physical Exam: Constitutional: Patient appears well-developed and well-nourished. Not in obvious distress. HENT: Normocephalic, atraumatic, External right and left ear normal. Oropharynx is clear and moist.  Eyes: Conjunctivae and EOM are normal. PERRLA, no scleral icterus. Neck: Normal ROM. Neck supple. No JVD. No tracheal deviation. No thyromegaly. CVS: RRR, S1/S2 +, no murmurs, no gallops, no carotid bruit.  Pulmonary: Effort and breath sounds normal, no stridor, rhonchi, wheezes, rales.  Abdominal: Soft. BS +, no distension, tenderness, rebound or guarding.  Musculoskeletal: Normal range of motion. No edema and no tenderness.  Lymphadenopathy: No lymphadenopathy noted, cervical, inguinal or axillary Neuro: Alert. Normal reflexes, muscle tone coordination. No cranial nerve deficit. Skin: Skin is warm and dry. No rash noted.  Not diaphoretic. No erythema. No pallor. Psychiatric: Normal mood and affect. Behavior, judgment, thought content normal.   Data Review   Micro Results No results found for this or any previous visit (from the past 240 hour(s)).   CBC No results for input(s): "WBC", "HGB", "HCT", "PLT", "MCV", "MCH", "MCHC", "RDW", "LYMPHSABS", "MONOABS", "EOSABS", "BASOSABS", "BANDABS" in the last 168 hours.  Invalid input(s): "NEUTRABS", "BANDSABD"  Chemistries  No results for input(s): "NA", "K", "CL", "CO2", "GLUCOSE", "BUN", "CREATININE", "CALCIUM", "MG", "AST", "ALT", "ALKPHOS", "  BILITOT" in the last 168 hours.  Invalid input(s): "GFRCGP" ------------------------------------------------------------------------------------------------------------------ CrCl cannot be calculated (Patient's most recent lab result is older than the maximum 21 days allowed.). ------------------------------------------------------------------------------------------------------------------ No results for input(s): "HGBA1C" in the last 72 hours. ------------------------------------------------------------------------------------------------------------------ No results for input(s): "CHOL", "HDL", "LDLCALC", "TRIG", "CHOLHDL", "LDLDIRECT" in the last 72 hours. ------------------------------------------------------------------------------------------------------------------ No results for input(s): "TSH", "T4TOTAL", "T3FREE", "THYROIDAB" in the last 72 hours.  Invalid input(s): "FREET3" ------------------------------------------------------------------------------------------------------------------ No results for input(s): "VITAMINB12", "FOLATE", "FERRITIN", "TIBC", "IRON", "RETICCTPCT" in the last 72 hours.  Coagulation profile No results for input(s): "INR", "PROTIME" in the last 168 hours.  No results for input(s): "DDIMER" in the last 72 hours.  Cardiac Enzymes No results for input(s): "CKMB", "TROPONINI",  "MYOGLOBIN" in the last 168 hours.  Invalid input(s): "CK" ------------------------------------------------------------------------------------------------------------------ Invalid input(s): "POCBNP" Time spent in minutes: 30     Elizabeth Sauer M.D on 09/04/2022 at 4:21 PM   **Disclaimer: This note may have been dictated with voice recognition software. Similar sounding words can inadvertently be transcribed and this note may contain transcription errors which may not have been corrected upon publication of note.**    Assessment and Plan: 1. Aortic atherosclerosis (HCC) Patient was noted to have atherosclerosis on imaging and we will initiate evaluation of hyperlipidemia for control.  We will check lipid panel.  Blood pressure is 120/70.  And patient is currently not smoking - Lipid Panel With LDL/HDL Ratio  2. Abnormal BUN-to-creatinine ratio Patient was noted to have abnormal BUN to creatinine ratio and we will repeat renal function panel for reassessment. - Renal Function Panel  3. Pure hypercholesterolemia Patient was noted to have atherosclerosis and we will evaluate current lipid level.  In the meantime we will give her a low-cholesterol low triglyceride dietary guidelines for patient to follow-up. - Lipid Panel With LDL/HDL Ratio     Elizabeth Sauer, MD

## 2022-09-05 LAB — RENAL FUNCTION PANEL
Albumin: 3.9 g/dL (ref 3.9–4.9)
BUN/Creatinine Ratio: 5 — ABNORMAL LOW (ref 12–28)
BUN: 3 mg/dL — ABNORMAL LOW (ref 8–27)
CO2: 24 mmol/L (ref 20–29)
Calcium: 9.4 mg/dL (ref 8.7–10.3)
Chloride: 104 mmol/L (ref 96–106)
Creatinine, Ser: 0.55 mg/dL — ABNORMAL LOW (ref 0.57–1.00)
Glucose: 98 mg/dL (ref 70–99)
Phosphorus: 3.6 mg/dL (ref 3.0–4.3)
Potassium: 3.4 mmol/L — ABNORMAL LOW (ref 3.5–5.2)
Sodium: 141 mmol/L (ref 134–144)
eGFR: 101 mL/min/{1.73_m2} (ref >59)

## 2022-09-05 LAB — LIPID PANEL WITH LDL/HDL RATIO
Cholesterol, Total: 168 mg/dL (ref 100–199)
HDL: 44 mg/dL (ref >39)
LDL Chol Calc (NIH): 102 mg/dL — ABNORMAL HIGH (ref 0–99)
LDL/HDL Ratio: 2.3 ratio (ref 0.0–3.2)
Triglycerides: 125 mg/dL (ref 0–149)
VLDL Cholesterol Cal: 22 mg/dL (ref 5–40)

## 2022-09-07 ENCOUNTER — Encounter: Payer: Self-pay | Admitting: Family Medicine

## 2022-09-07 NOTE — Patient Instructions (Signed)

## 2022-11-17 ENCOUNTER — Telehealth: Payer: Self-pay | Admitting: Family Medicine

## 2022-11-17 NOTE — Telephone Encounter (Signed)
Spoke to patient about her labs that she will have them done when she comes in October 22, before I could finish we were disconnected.

## 2022-11-17 NOTE — Telephone Encounter (Signed)
Patient called sttd provider wanted her to have labs done before her appt on 10/22. Patient would like to come in on 10/15 in he morning. Please f/u with patient for appt

## 2022-12-09 ENCOUNTER — Ambulatory Visit: Payer: BC Managed Care – PPO | Admitting: Family Medicine

## 2022-12-30 ENCOUNTER — Ambulatory Visit: Payer: BC Managed Care – PPO | Admitting: Family Medicine

## 2023-02-12 ENCOUNTER — Ambulatory Visit: Payer: BC Managed Care – PPO | Admitting: Family Medicine

## 2023-02-16 ENCOUNTER — Ambulatory Visit: Payer: BC Managed Care – PPO | Admitting: Family Medicine

## 2023-03-09 ENCOUNTER — Ambulatory Visit: Payer: 59 | Admitting: Family Medicine

## 2023-03-13 ENCOUNTER — Ambulatory Visit (INDEPENDENT_AMBULATORY_CARE_PROVIDER_SITE_OTHER): Payer: 59 | Admitting: Family Medicine

## 2023-03-13 ENCOUNTER — Encounter: Payer: Self-pay | Admitting: Family Medicine

## 2023-03-13 VITALS — BP 128/74 | HR 87 | Ht 62.0 in | Wt 127.0 lb

## 2023-03-13 DIAGNOSIS — E876 Hypokalemia: Secondary | ICD-10-CM

## 2023-03-13 NOTE — Progress Notes (Signed)
Date:  03/13/2023   Name:  Lydia Carter   DOB:  [birth year]   MRN:  [id number]   Chief Complaint: hypokalemia  Patient is a 68 year old female who presents for a recheckm electrolytess exam. The patient reports the following problems: hypokalemia. Health maintenance has been reviewed up to date      Lab Results  Component Value Date   NA 141 09/04/2022   K 3.4 (L) 09/04/2022   CO2 24 09/04/2022   GLUCOSE 98 09/04/2022   BUN 3 (L) 09/04/2022   CREATININE 0.55 (L) 09/04/2022   CALCIUM 9.4 09/04/2022   EGFR 101 09/04/2022   Lab Results  Component Value Date   CHOL 168 09/04/2022   HDL 44 09/04/2022   LDLCALC 102 (H) 09/04/2022   LDLDIRECT 131.0 02/28/2016   TRIG 125 09/04/2022   CHOLHDL 5 03/09/2017   Lab Results  Component Value Date   TSH <0.01 (L) 01/12/2017   No results found for: "HGBA1C" Lab Results  Component Value Date   WBC 10.2 03/09/2017   HGB 12.7 03/09/2017   HCT 38.8 03/09/2017   MCV 87.6 03/09/2017   PLT 264.0 03/09/2017   Lab Results  Component Value Date   ALT 7 08/24/2018   AST 13 08/24/2018   ALKPHOS 85 08/24/2018   BILITOT 0.4 08/24/2018   Lab Results  Component Value Date   VD25OH 11.78 (L) 03/09/2017     Review of Systems  Constitutional:  Negative for unexpected weight change.  Eyes:  Negative for visual disturbance.  Respiratory:  Negative for choking, shortness of breath and wheezing.   Cardiovascular:  Negative for chest pain, palpitations and leg swelling.  Gastrointestinal:  Negative for abdominal distention and abdominal pain.  Endocrine: Negative for polydipsia and polyuria.  Genitourinary:  Negative for difficulty urinating.    Patient Active Problem List   Diagnosis Date Noted   Mass of left inguinal region 01/06/2022   Rectal bleeding    Polyp of colon    Elevated blood pressure reading 04/15/2018   Vitamin D deficiency 12/18/2017   BCC (basal cell carcinoma) 09/07/2017   GERD (gastroesophageal reflux  disease) 01/12/2017   HLD (hyperlipidemia) 01/12/2017   Hyperthyroidism 02/28/2016   Adjustment disorder with mixed anxiety and depressed mood 02/28/2016   Migraine 07/25/2014   Allergic rhinitis 07/25/2014   Arthritis 07/25/2014    Allergies  Allergen Reactions   Atenolol     Made her "emotional and cry"   Codeine Nausea And Vomiting    Past Surgical History:  Procedure Laterality Date   COLONOSCOPY WITH PROPOFOL N/A 12/21/2020   Procedure: COLONOSCOPY WITH BIOPSY;  Surgeon: Midge Minium, MD;  Location: [facility];  Service: Endoscopy;  Laterality: N/A;   KNEE SURGERY Left    laproscopic   POLYPECTOMY N/A 12/21/2020   Procedure: POLYPECTOMY;  Surgeon: Midge Minium, MD;  Location: [facility];  Service: Endoscopy;  Laterality: N/A;   TONSILECTOMY, ADENOIDECTOMY, BILATERAL MYRINGOTOMY AND TUBES     TUBAL LIGATION      Social History   Tobacco Use   Smoking status: Former    Current packs/day: 0.00    Types: Cigarettes    Quit date: 08/18/2011    Years since quitting: 11.5   Smokeless tobacco: Never  Vaping Use   Vaping status: Never Used  Substance Use Topics   Alcohol use: Not Currently   Drug use: No     Medication list has been reviewed and updated.  Current Meds  Medication Sig   acetaminophen (TYLENOL) 500 MG tablet Take 1,000 mg by mouth every 8 (eight) hours as needed.   fexofenadine (ALLEGRA) 180 MG tablet Take 1 tablet (180 mg total) by mouth daily.   ipratropium (ATROVENT) 0.06 % nasal spray Place 2 sprays into both nostrils 4 (four) times daily.   methimazole (TAPAZOLE) 10 MG tablet Take 10 mg by mouth daily.   ondansetron (ZOFRAN-ODT) 8 MG disintegrating tablet Take 1 tablet (8 mg total) by mouth every 8 (eight) hours as needed for nausea.   valACYclovir (VALTREX) 1000 MG tablet Take 1 tablet (1,000 mg total) by mouth daily.   [DISCONTINUED] lidocaine 4 % Place 1 patch onto the skin daily.   [DISCONTINUED] meloxicam (MOBIC) 7.5 MG tablet  Take 1 tablet (7.5 mg total) by mouth daily.       03/13/2023    3:34 PM 09/04/2022    3:49 PM 06/09/2022    3:14 PM 05/09/2022   10:23 AM  GAD 7 : Generalized Anxiety Score  Nervous, Anxious, on Edge 2 0 0 3  Control/stop worrying 2 0 0 2  Worry too much - different things 2 0 0 2  Trouble relaxing 0 0 0 2  Restless 0 0 0 2  Easily annoyed or irritable 0 0 0 2  Afraid - awful might happen 0 0 0 2  Total GAD 7 Score 6 0 0 15  Anxiety Difficulty Not difficult at all Not difficult at all Not difficult at all Somewhat difficult       03/13/2023    3:34 PM 09/04/2022    3:49 PM 06/09/2022    3:13 PM  Depression screen PHQ 2/9  Decreased Interest 0 0 0  Down, Depressed, Hopeless 0 0 0  PHQ - 2 Score 0 0 0  Altered sleeping 0 0 0  Tired, decreased energy 0 0 0  Change in appetite 0 0 0  Feeling bad or failure about yourself  0 0 0  Trouble concentrating 0 0 0  Moving slowly or fidgety/restless 0 0 0  Suicidal thoughts 0 0 0  PHQ-9 Score 0 0 0  Difficult doing work/chores Not difficult at all Not difficult at all Not difficult at all    BP Readings from Last 3 Encounters:  03/13/23 128/74  09/04/22 120/70  06/09/22 128/78    Physical Exam Vitals and nursing note reviewed.  HENT:     Right Ear: Tympanic membrane and ear canal normal.     Left Ear: Tympanic membrane and ear canal normal.     Nose: Nose normal. No congestion.     Mouth/Throat:     Mouth: Mucous membranes are moist.  Eyes:     Conjunctiva/sclera: Conjunctivae normal.  Cardiovascular:     Rate and Rhythm: Normal rate and regular rhythm.     Heart sounds: No murmur heard.    No friction rub. No gallop.  Pulmonary:     Breath sounds: No wheezing, rhonchi or rales.  Abdominal:     General: Bowel sounds are normal.     Tenderness: There is no abdominal tenderness.  Neurological:     Mental Status: She is alert.     Wt Readings from Last 3 Encounters:  03/13/23 127 lb (57.6 kg)  09/04/22 121 lb (54.9  kg)  06/09/22 121 lb (54.9 kg)    BP 128/74   Pulse 87   Ht 5\' 2"  (1.575 m)   Wt 127 lb (57.6 kg)   SpO2 97%  BMI 23.23 kg/m   Assessment and Plan:  1. Hypokalemia (Primary) Patient with slight decrease in potassium on renal panel that was done in July at which time potassium was noted to be 3.4 with the cutoff being 3.5 serum creatinine is 0.55 and GFR was noted to be excellent at 101.  We will recheck for return of hypokalemia  Chassidy Melanee Left, CMA

## 2023-03-17 ENCOUNTER — Encounter: Payer: Self-pay | Admitting: Family Medicine

## 2023-03-17 LAB — COMPREHENSIVE METABOLIC PANEL
ALT: 6 [IU]/L (ref 0–32)
AST: 13 [IU]/L (ref 0–40)
Albumin: 4.2 g/dL (ref 3.9–4.9)
Alkaline Phosphatase: 137 [IU]/L — ABNORMAL HIGH (ref 44–121)
BUN/Creatinine Ratio: 15 (ref 12–28)
BUN: 10 mg/dL (ref 8–27)
Bilirubin Total: <0.2 mg/dL (ref 0.0–1.2)
CO2: 21 mmol/L (ref 20–29)
Calcium: 9.2 mg/dL (ref 8.7–10.3)
Chloride: 105 mmol/L (ref 96–106)
Creatinine, Ser: 0.66 mg/dL (ref 0.57–1.00)
Globulin, Total: 2.6 g/dL (ref 1.5–4.5)
Glucose: 91 mg/dL (ref 70–99)
Potassium: 3.9 mmol/L (ref 3.5–5.2)
Sodium: 143 mmol/L (ref 134–144)
Total Protein: 6.8 g/dL (ref 6.0–8.5)
eGFR: 97 mL/min/{1.73_m2} (ref >59)
